# Patient Record
Sex: Female | Born: 1960
Health system: Southern US, Community
[De-identification: ages and names within clinical notes are randomized; demographics above are authoritative.]

## PROBLEM LIST (undated history)

## (undated) DIAGNOSIS — E039 Hypothyroidism, unspecified: Secondary | ICD-10-CM

## (undated) DIAGNOSIS — J45909 Unspecified asthma, uncomplicated: Secondary | ICD-10-CM

## (undated) DIAGNOSIS — D509 Iron deficiency anemia, unspecified: Secondary | ICD-10-CM

## (undated) DIAGNOSIS — G9332 Myalgic encephalomyelitis/chronic fatigue syndrome: Secondary | ICD-10-CM

## (undated) DIAGNOSIS — I10 Essential (primary) hypertension: Secondary | ICD-10-CM

## (undated) DIAGNOSIS — E785 Hyperlipidemia, unspecified: Secondary | ICD-10-CM

## (undated) DIAGNOSIS — Z8489 Family history of other specified conditions: Secondary | ICD-10-CM

## (undated) DIAGNOSIS — E063 Autoimmune thyroiditis: Secondary | ICD-10-CM

## (undated) DIAGNOSIS — Z87442 Personal history of urinary calculi: Secondary | ICD-10-CM

## (undated) DIAGNOSIS — G4730 Sleep apnea, unspecified: Secondary | ICD-10-CM

## (undated) DIAGNOSIS — R5382 Chronic fatigue, unspecified: Secondary | ICD-10-CM

## (undated) DIAGNOSIS — M797 Fibromyalgia: Secondary | ICD-10-CM

## (undated) DIAGNOSIS — M1990 Unspecified osteoarthritis, unspecified site: Secondary | ICD-10-CM

## (undated) DIAGNOSIS — F419 Anxiety disorder, unspecified: Secondary | ICD-10-CM

## (undated) DIAGNOSIS — I2510 Atherosclerotic heart disease of native coronary artery without angina pectoris: Secondary | ICD-10-CM

## (undated) HISTORY — PX: EAR CYST EXCISION: SHX22

## (undated) HISTORY — DX: Autoimmune thyroiditis: E06.3

## (undated) HISTORY — PX: LAPAROSCOPIC TOTAL HYSTERECTOMY: SUR800

## (undated) HISTORY — DX: Myalgic encephalomyelitis/chronic fatigue syndrome: G93.32

## (undated) HISTORY — PX: OTHER SURGICAL HISTORY: SHX169

## (undated) HISTORY — DX: Fibromyalgia: M79.7

## (undated) HISTORY — DX: Hyperlipidemia, unspecified: E78.5

## (undated) HISTORY — DX: Hypothyroidism, unspecified: E03.9

## (undated) HISTORY — DX: Iron deficiency anemia, unspecified: D50.9

## (undated) HISTORY — PX: URETERAL STENT PLACEMENT: SHX822

## (undated) HISTORY — DX: Chronic fatigue, unspecified: R53.82

## (undated) HISTORY — DX: Essential (primary) hypertension: I10

---
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
## 1998-06-24 ENCOUNTER — Emergency Department (HOSPITAL_COMMUNITY): Admission: EM | Admit: 1998-06-24 | Discharge: 1998-06-24 | Payer: Self-pay | Admitting: Emergency Medicine

## 1998-10-06 ENCOUNTER — Other Ambulatory Visit: Admission: RE | Admit: 1998-10-06 | Discharge: 1998-10-06 | Payer: Self-pay | Admitting: Obstetrics and Gynecology

## 1999-12-29 ENCOUNTER — Other Ambulatory Visit: Admission: RE | Admit: 1999-12-29 | Discharge: 1999-12-29 | Payer: Self-pay | Admitting: Gynecology

## 2004-02-23 ENCOUNTER — Emergency Department (HOSPITAL_COMMUNITY): Admission: EM | Admit: 2004-02-23 | Discharge: 2004-02-23 | Payer: Self-pay | Admitting: Emergency Medicine

## 2011-11-14 HISTORY — PX: VAGINAL HYSTERECTOMY: SUR661

## 2013-01-21 ENCOUNTER — Other Ambulatory Visit: Payer: Self-pay | Admitting: Otolaryngology

## 2013-01-21 DIAGNOSIS — H906 Mixed conductive and sensorineural hearing loss, bilateral: Secondary | ICD-10-CM

## 2013-01-21 DIAGNOSIS — H7190 Unspecified cholesteatoma, unspecified ear: Secondary | ICD-10-CM

## 2013-01-21 DIAGNOSIS — H6980 Other specified disorders of Eustachian tube, unspecified ear: Secondary | ICD-10-CM

## 2013-01-21 DIAGNOSIS — H7100 Cholesteatoma of attic, unspecified ear: Secondary | ICD-10-CM

## 2013-01-24 ENCOUNTER — Ambulatory Visit
Admission: RE | Admit: 2013-01-24 | Discharge: 2013-01-24 | Disposition: A | Discharge status: Home / Self Care | Payer: Medicare Other | Source: Ambulatory Visit | Attending: Otolaryngology | Admitting: Otolaryngology

## 2013-01-24 DIAGNOSIS — H7100 Cholesteatoma of attic, unspecified ear: Secondary | ICD-10-CM

## 2013-01-24 DIAGNOSIS — H6980 Other specified disorders of Eustachian tube, unspecified ear: Secondary | ICD-10-CM

## 2013-01-24 DIAGNOSIS — H7190 Unspecified cholesteatoma, unspecified ear: Secondary | ICD-10-CM

## 2013-01-24 DIAGNOSIS — H906 Mixed conductive and sensorineural hearing loss, bilateral: Secondary | ICD-10-CM

## 2014-12-25 DIAGNOSIS — R1011 Right upper quadrant pain: Secondary | ICD-10-CM | POA: Diagnosis not present

## 2014-12-28 DIAGNOSIS — R112 Nausea with vomiting, unspecified: Secondary | ICD-10-CM | POA: Diagnosis not present

## 2014-12-28 DIAGNOSIS — N132 Hydronephrosis with renal and ureteral calculous obstruction: Secondary | ICD-10-CM | POA: Diagnosis not present

## 2014-12-28 DIAGNOSIS — J45909 Unspecified asthma, uncomplicated: Secondary | ICD-10-CM | POA: Diagnosis not present

## 2014-12-28 DIAGNOSIS — R109 Unspecified abdominal pain: Secondary | ICD-10-CM | POA: Diagnosis not present

## 2014-12-28 DIAGNOSIS — R1011 Right upper quadrant pain: Secondary | ICD-10-CM | POA: Diagnosis not present

## 2014-12-28 DIAGNOSIS — N200 Calculus of kidney: Secondary | ICD-10-CM | POA: Diagnosis not present

## 2014-12-28 DIAGNOSIS — R1013 Epigastric pain: Secondary | ICD-10-CM | POA: Diagnosis not present

## 2014-12-28 DIAGNOSIS — I10 Essential (primary) hypertension: Secondary | ICD-10-CM | POA: Diagnosis not present

## 2014-12-28 DIAGNOSIS — F329 Major depressive disorder, single episode, unspecified: Secondary | ICD-10-CM | POA: Diagnosis not present

## 2014-12-29 DIAGNOSIS — N132 Hydronephrosis with renal and ureteral calculous obstruction: Secondary | ICD-10-CM | POA: Diagnosis not present

## 2015-01-05 DIAGNOSIS — R110 Nausea: Secondary | ICD-10-CM | POA: Diagnosis not present

## 2015-01-05 DIAGNOSIS — R1011 Right upper quadrant pain: Secondary | ICD-10-CM | POA: Diagnosis not present

## 2015-01-18 DIAGNOSIS — J1100 Influenza due to unidentified influenza virus with unspecified type of pneumonia: Secondary | ICD-10-CM | POA: Diagnosis not present

## 2015-01-25 DIAGNOSIS — I10 Essential (primary) hypertension: Secondary | ICD-10-CM | POA: Diagnosis not present

## 2015-01-25 DIAGNOSIS — J9801 Acute bronchospasm: Secondary | ICD-10-CM | POA: Diagnosis not present

## 2015-01-25 DIAGNOSIS — Z79899 Other long term (current) drug therapy: Secondary | ICD-10-CM | POA: Diagnosis not present

## 2015-01-25 DIAGNOSIS — E785 Hyperlipidemia, unspecified: Secondary | ICD-10-CM | POA: Diagnosis not present

## 2015-01-25 DIAGNOSIS — E039 Hypothyroidism, unspecified: Secondary | ICD-10-CM | POA: Diagnosis not present

## 2015-01-25 DIAGNOSIS — R5383 Other fatigue: Secondary | ICD-10-CM | POA: Diagnosis not present

## 2015-01-25 DIAGNOSIS — E559 Vitamin D deficiency, unspecified: Secondary | ICD-10-CM | POA: Diagnosis not present

## 2015-03-30 DIAGNOSIS — W6139XS Other contact with chicken, sequela: Secondary | ICD-10-CM | POA: Diagnosis not present

## 2015-03-30 DIAGNOSIS — L03116 Cellulitis of left lower limb: Secondary | ICD-10-CM | POA: Diagnosis not present

## 2015-04-15 DIAGNOSIS — L820 Inflamed seborrheic keratosis: Secondary | ICD-10-CM | POA: Diagnosis not present

## 2015-04-15 DIAGNOSIS — D235 Other benign neoplasm of skin of trunk: Secondary | ICD-10-CM | POA: Diagnosis not present

## 2015-04-20 DIAGNOSIS — R790 Abnormal level of blood mineral: Secondary | ICD-10-CM | POA: Diagnosis not present

## 2015-04-20 DIAGNOSIS — G2581 Restless legs syndrome: Secondary | ICD-10-CM | POA: Diagnosis not present

## 2015-04-20 DIAGNOSIS — E039 Hypothyroidism, unspecified: Secondary | ICD-10-CM | POA: Diagnosis not present

## 2015-04-20 DIAGNOSIS — I10 Essential (primary) hypertension: Secondary | ICD-10-CM | POA: Diagnosis not present

## 2015-04-20 DIAGNOSIS — Z79899 Other long term (current) drug therapy: Secondary | ICD-10-CM | POA: Diagnosis not present

## 2015-04-20 DIAGNOSIS — M797 Fibromyalgia: Secondary | ICD-10-CM | POA: Diagnosis not present

## 2015-04-20 DIAGNOSIS — E559 Vitamin D deficiency, unspecified: Secondary | ICD-10-CM | POA: Diagnosis not present

## 2015-12-14 DIAGNOSIS — E039 Hypothyroidism, unspecified: Secondary | ICD-10-CM | POA: Diagnosis not present

## 2015-12-14 DIAGNOSIS — M797 Fibromyalgia: Secondary | ICD-10-CM | POA: Diagnosis not present

## 2015-12-14 DIAGNOSIS — I10 Essential (primary) hypertension: Secondary | ICD-10-CM | POA: Diagnosis not present

## 2015-12-14 DIAGNOSIS — R413 Other amnesia: Secondary | ICD-10-CM | POA: Diagnosis not present

## 2015-12-14 DIAGNOSIS — R5382 Chronic fatigue, unspecified: Secondary | ICD-10-CM | POA: Diagnosis not present

## 2015-12-15 DIAGNOSIS — E039 Hypothyroidism, unspecified: Secondary | ICD-10-CM | POA: Diagnosis not present

## 2015-12-15 DIAGNOSIS — R790 Abnormal level of blood mineral: Secondary | ICD-10-CM | POA: Diagnosis not present

## 2015-12-15 DIAGNOSIS — E785 Hyperlipidemia, unspecified: Secondary | ICD-10-CM | POA: Diagnosis not present

## 2015-12-15 DIAGNOSIS — R413 Other amnesia: Secondary | ICD-10-CM | POA: Diagnosis not present

## 2015-12-15 DIAGNOSIS — E559 Vitamin D deficiency, unspecified: Secondary | ICD-10-CM | POA: Diagnosis not present

## 2015-12-15 DIAGNOSIS — Z79899 Other long term (current) drug therapy: Secondary | ICD-10-CM | POA: Diagnosis not present

## 2015-12-15 DIAGNOSIS — I10 Essential (primary) hypertension: Secondary | ICD-10-CM | POA: Diagnosis not present

## 2015-12-22 DIAGNOSIS — R413 Other amnesia: Secondary | ICD-10-CM | POA: Diagnosis not present

## 2016-01-06 DIAGNOSIS — J019 Acute sinusitis, unspecified: Secondary | ICD-10-CM | POA: Diagnosis not present

## 2016-01-06 DIAGNOSIS — J209 Acute bronchitis, unspecified: Secondary | ICD-10-CM | POA: Diagnosis not present

## 2016-01-13 DIAGNOSIS — R233 Spontaneous ecchymoses: Secondary | ICD-10-CM | POA: Diagnosis not present

## 2016-01-13 DIAGNOSIS — B079 Viral wart, unspecified: Secondary | ICD-10-CM | POA: Diagnosis not present

## 2016-01-13 DIAGNOSIS — L853 Xerosis cutis: Secondary | ICD-10-CM | POA: Diagnosis not present

## 2016-01-14 DIAGNOSIS — Z1231 Encounter for screening mammogram for malignant neoplasm of breast: Secondary | ICD-10-CM | POA: Diagnosis not present

## 2016-02-14 DIAGNOSIS — L03116 Cellulitis of left lower limb: Secondary | ICD-10-CM | POA: Diagnosis not present

## 2016-02-14 DIAGNOSIS — S91332A Puncture wound without foreign body, left foot, initial encounter: Secondary | ICD-10-CM | POA: Diagnosis not present

## 2016-02-22 DIAGNOSIS — E039 Hypothyroidism, unspecified: Secondary | ICD-10-CM | POA: Diagnosis not present

## 2016-02-23 DIAGNOSIS — M797 Fibromyalgia: Secondary | ICD-10-CM | POA: Diagnosis not present

## 2016-02-23 DIAGNOSIS — M79672 Pain in left foot: Secondary | ICD-10-CM | POA: Diagnosis not present

## 2016-06-13 DIAGNOSIS — E785 Hyperlipidemia, unspecified: Secondary | ICD-10-CM | POA: Diagnosis not present

## 2016-06-13 DIAGNOSIS — E039 Hypothyroidism, unspecified: Secondary | ICD-10-CM | POA: Diagnosis not present

## 2016-06-13 DIAGNOSIS — J3489 Other specified disorders of nose and nasal sinuses: Secondary | ICD-10-CM | POA: Diagnosis not present

## 2016-06-13 DIAGNOSIS — E611 Iron deficiency: Secondary | ICD-10-CM | POA: Diagnosis not present

## 2016-06-13 DIAGNOSIS — I10 Essential (primary) hypertension: Secondary | ICD-10-CM | POA: Diagnosis not present

## 2016-06-13 DIAGNOSIS — R5382 Chronic fatigue, unspecified: Secondary | ICD-10-CM | POA: Diagnosis not present

## 2016-06-13 DIAGNOSIS — Z79899 Other long term (current) drug therapy: Secondary | ICD-10-CM | POA: Diagnosis not present

## 2016-07-05 DIAGNOSIS — R079 Chest pain, unspecified: Secondary | ICD-10-CM | POA: Diagnosis not present

## 2016-07-05 DIAGNOSIS — E611 Iron deficiency: Secondary | ICD-10-CM | POA: Diagnosis not present

## 2016-07-06 ENCOUNTER — Other Ambulatory Visit: Payer: Self-pay | Admitting: Internal Medicine

## 2016-07-06 DIAGNOSIS — R079 Chest pain, unspecified: Secondary | ICD-10-CM

## 2016-07-07 ENCOUNTER — Telehealth (HOSPITAL_COMMUNITY): Payer: Self-pay | Admitting: *Deleted

## 2016-07-07 NOTE — Telephone Encounter (Signed)
Patient given detailed instructions per Myocardial Perfusion Study Information Sheet for the test on 07/10/16 at 7:30. Patient notified to arrive 15 minutes early and that it is imperative to arrive on time for appointment to keep from having the test rescheduled.  If you need to cancel or reschedule your appointment, please call the office within 24 hours of your appointment. Failure to do so may result in a cancellation of your appointment, and a $50 no show fee. Patient verbalized understanding.Sabrina Snyder

## 2016-07-10 ENCOUNTER — Encounter (INDEPENDENT_AMBULATORY_CARE_PROVIDER_SITE_OTHER): Payer: Self-pay

## 2016-07-10 ENCOUNTER — Ambulatory Visit (HOSPITAL_COMMUNITY): Payer: Medicare Other | Attending: Cardiology

## 2016-07-10 DIAGNOSIS — I10 Essential (primary) hypertension: Secondary | ICD-10-CM | POA: Diagnosis not present

## 2016-07-10 DIAGNOSIS — R079 Chest pain, unspecified: Secondary | ICD-10-CM

## 2016-07-10 LAB — MYOCARDIAL PERFUSION IMAGING
CHL CUP NUCLEAR SRS: 0
CHL CUP NUCLEAR SSS: 1
LHR: 0.23
LV dias vol: 67 mL (ref 46–106)
LV sys vol: 20 mL
NUC STRESS TID: 1.02
Peak HR: 118 {beats}/min
Rest HR: 78 {beats}/min
SDS: 1

## 2016-07-10 MED ORDER — REGADENOSON 0.4 MG/5ML IV SOLN
0.4000 mg | Freq: Once | INTRAVENOUS | Status: AC
  Administered 2016-07-10: 0.4 mg via INTRAVENOUS

## 2016-07-10 MED ORDER — TECHNETIUM TC 99M TETROFOSMIN IV KIT
33.0000 | PACK | Freq: Once | INTRAVENOUS | Status: AC | PRN
  Administered 2016-07-10: 33 via INTRAVENOUS
  Filled 2016-07-10: qty 33

## 2016-07-10 MED ORDER — TECHNETIUM TC 99M TETROFOSMIN IV KIT
10.7000 | PACK | Freq: Once | INTRAVENOUS | Status: AC | PRN
  Administered 2016-07-10: 11 via INTRAVENOUS
  Filled 2016-07-10: qty 11

## 2016-07-18 DIAGNOSIS — I10 Essential (primary) hypertension: Secondary | ICD-10-CM | POA: Diagnosis not present

## 2016-09-12 DIAGNOSIS — Z23 Encounter for immunization: Secondary | ICD-10-CM | POA: Diagnosis not present

## 2016-09-12 DIAGNOSIS — I10 Essential (primary) hypertension: Secondary | ICD-10-CM | POA: Diagnosis not present

## 2016-09-12 DIAGNOSIS — E538 Deficiency of other specified B group vitamins: Secondary | ICD-10-CM | POA: Diagnosis not present

## 2016-09-12 DIAGNOSIS — R5382 Chronic fatigue, unspecified: Secondary | ICD-10-CM | POA: Diagnosis not present

## 2016-09-13 ENCOUNTER — Encounter: Payer: Self-pay | Admitting: Gastroenterology

## 2016-09-21 ENCOUNTER — Other Ambulatory Visit: Payer: Self-pay

## 2016-09-26 ENCOUNTER — Ambulatory Visit: Payer: Medicare Other | Admitting: Gastroenterology

## 2016-11-19 NOTE — Progress Notes (Deleted)
Cardiology Office Note   Date:  11/19/2016   ID:  BURLEY BARTOLUCCI, DOB 07-Aug-1961, MRN [id number]  PCP:  No primary care provider on file.  Cardiologist:   Jenkins Rouge, MD   No chief complaint on file.     History of Present Illness: Sabrina Snyder is a 56 y.o. female who presents for evaluation of chest pain.   Myovue reviewed done 07/10/16 normal perfusion EF 70%   Past Medical History:  Diagnosis Date  . CFS (chronic fatigue syndrome)   . Fibromyalgia   . Hashimoto's disease   . IDA (iron deficiency anemia)     Past Surgical History:  Procedure Laterality Date  . LAPAROSCOPIC TOTAL HYSTERECTOMY    . thyroid ablation       Current Outpatient Prescriptions  Medication Sig Dispense Refill  . amLODipine (NORVASC) 5 MG tablet Take 1 tablet (5 mg total) by mouth daily.    . benazepril (LOTENSIN) 40 MG tablet Take 1 tablet (40 mg total) by mouth daily.    [name] buPROPion (WELLBUTRIN SR) 150 MG 12 hr tablet Take 1 tablet (150 mg total) by mouth 2 (two) times daily.    . cyclobenzaprine (FLEXERIL) 10 MG tablet Take 1 tablet (10 mg total) by mouth at bedtime. 30 tablet 0  . DULoxetine (CYMBALTA) 60 MG capsule Take 1 capsule (60 mg total) by mouth daily.  3  . estrogens, conjugated, (PREMARIN) 0.625 MG tablet Take 1 tablet (0.625 mg total) by mouth daily. Take daily for 21 days then do not take for 7 days.    [name] gabapentin (NEURONTIN) 300 MG capsule Take 1 capsule (300 mg total) by mouth 3 (three) times daily.    . hydrochlorothiazide (HYDRODIURIL) 25 MG tablet Take 1 tablet (25 mg total) by mouth daily.    . pramipexole (MIRAPEX) 0.125 MG tablet Take 1 tablet (0.125 mg total) by mouth 3 (three) times daily.    [name] thyroid (ARMOUR THYROID) 120 MG tablet Take 1 tablet (120 mg total) by mouth daily before breakfast.    . traMADol (ULTRAM) 50 MG tablet Take 1 tablet (50 mg total) by mouth every 6 (six) hours as needed. 30 tablet 0   No current facility-administered medications for  this visit.     Allergies:   Patient has no known allergies.    Social History:  The patient  reports that she has never smoked. She does not have any smokeless tobacco history on file. She reports that she does not drink alcohol.   Family History:  The patient's family history includes Atrial fibrillation in her mother; Diabetes in her father; Heart disease in her father; Hyperlipidemia in her father; Hypertension in her brother, father, mother, sister, and sister; Pancreatic cancer in her father; Schizophrenia in her sister.    ROS:  Please see the history of present illness.   Otherwise, review of systems are positive for {NONE DEFAULTED:18576::"none"}.   All other systems are reviewed and negative.    PHYSICAL EXAM: VS:  There were no vitals taken for this visit. , BMI There is no height or weight on file to calculate BMI. Affect appropriate Healthy:  appears stated age 84: normal Neck supple with no adenopathy JVP normal no bruits no thyromegaly Lungs clear with no wheezing and good diaphragmatic motion Heart:  S1/S2 no murmur, no rub, gallop or click PMI normal Abdomen: benighn, BS positve, no tenderness, no AAA no bruit.  No HSM or HJR Distal pulses intact with no bruits  No edema Neuro non-focal Skin warm and dry No muscular weakness    EKG:  ***   Recent Labs: No results found for requested labs within last 8760 hours.    Lipid Panel No results found for: CHOL, TRIG, HDL, CHOLHDL, VLDL, LDLCALC, LDLDIRECT    Wt Readings from Last 3 Encounters:  No data found for Wt      Other studies Reviewed: Additional studies/ records that were reviewed today include: ***.    ASSESSMENT AND PLAN:  1.  ***   Current medicines are reviewed at length with the patient today.  The patient {ACTIONS; HAS/DOES NOT HAVE:19233} concerns regarding medicines.  The following changes have been made:  {PLAN; NO CHANGE:13088:s}  Labs/ tests ordered today include: *** No  orders of the defined types were placed in this encounter.    Disposition:   FU with ***     Signed, Jenkins Rouge, MD  11/19/2016 1:10 PM    [facility] [place], [place], [place]  [address] Phone: [phone]; Fax: [phone]

## 2016-11-22 ENCOUNTER — Ambulatory Visit: Payer: Medicare Other | Admitting: Cardiovascular Disease

## 2016-12-25 NOTE — Progress Notes (Signed)
Cardiology Office Note   Date:  01/04/2017   ID:  Sabrina Snyder, DOB 1961/04/21, MRN [id number]  PCP:  Maurice Small  Cardiologist:   Jenkins Rouge, MD   Chief Complaint  Patient presents with  . Establish Care      History of Present Illness: Sabrina Snyder is a 56 y.o. female who presents for chest pain. History of CFS (chronic fatigue syndrome)  disabled through SS For over 20 years. Fibromyalgia Rx with tramadol and gabapentin.  Gets tightness between breast bones, experiences pressure on both sides of anterior chest. Random, never with exercise Some pain when moving [place]. ASA improved Father MI in 26's She is concerned cause he had normal stress test before needing CABG  Recently had ACE -> ARB ? Due to angioedema   LexiscanMyovue 07/10/16 reviewed Normal no ischemia or infarction EF 70% HTN response to exercise   Past Medical History:  Diagnosis Date  . CFS (chronic fatigue syndrome)   . Fibromyalgia   . Hashimoto's disease   . IDA (iron deficiency anemia)     Past Surgical History:  Procedure Laterality Date  . LAPAROSCOPIC TOTAL HYSTERECTOMY    . thyroid ablation       Current Outpatient Prescriptions  Medication Sig Dispense Refill  . acetaZOLAMIDE (DIAMOX) 250 MG tablet Take 250 mg by mouth 3 (three) times daily as needed (headaches).    . ALPRAZolam (XANAX) 0.25 MG tablet Take 0.25 mg by mouth daily as needed for anxiety.    [name] amLODipine (NORVASC) 5 MG tablet Take 1 tablet (5 mg total) by mouth daily.    . benazepril (LOTENSIN) 40 MG tablet Take 1 tablet (40 mg total) by mouth daily.    [name] buPROPion (WELLBUTRIN SR) 150 MG 12 hr tablet Take 1 tablet (150 mg total) by mouth 2 (two) times daily.    . Cyanocobalamin (VITAMIN B-12) 5000 MCG SUBL Place 1 each under the tongue daily.    . cyclobenzaprine (FLEXERIL) 10 MG tablet Take 1 tablet (10 mg total) by mouth at bedtime. 30 tablet 0  . DULoxetine (CYMBALTA) 60 MG capsule Take 1 capsule (60 mg total) by  mouth daily.  3  . estrogens, conjugated, (PREMARIN) 0.625 MG tablet Take 1 tablet (0.625 mg total) by mouth daily. Take daily for 21 days then do not take for 7 days.    [name] gabapentin (NEURONTIN) 300 MG capsule Take 1 capsule (300 mg total) by mouth 3 (three) times daily.    . hydrochlorothiazide (HYDRODIURIL) 25 MG tablet Take 1 tablet (25 mg total) by mouth daily.    . IRON PO Take 1 tablet by mouth daily.    [name] losartan (COZAAR) 100 MG tablet Take 100 mg by mouth daily.    . nitroGLYCERIN (NITROSTAT) 0.4 MG SL tablet Place 0.4 mg under the tongue every 5 (five) minutes x 3 doses as needed for chest pain.    . Omega-3 Fatty Acids (SALMON OIL-1000 PO) Take 1 tablet by mouth daily.    . pramipexole (MIRAPEX) 0.125 MG tablet Take 0.125 mg by mouth 3 (three) times daily as needed (restless leg).    [name] thyroid (ARMOUR THYROID) 120 MG tablet Take 1 tablet (120 mg total) by mouth daily before breakfast.    . traMADol (ULTRAM) 50 MG tablet Take 1 tablet (50 mg total) by mouth every 6 (six) hours as needed. 30 tablet 0   No current facility-administered medications for this visit.     Allergies:  Patient has no known allergies.    Social History:  The patient  reports that she has never smoked. She has never used smokeless tobacco. She reports that she does not drink alcohol.   Family History:  The patient's family history includes Atrial fibrillation in her mother; Diabetes in her father; Heart disease in her father; Hyperlipidemia in her father; Hypertension in her brother, father, mother, sister, and sister; Pancreatic cancer in her father; Schizophrenia in her sister.    ROS:  Please see the history of present illness.   Otherwise, review of systems are positive for none.   All other systems are reviewed and negative.    PHYSICAL EXAM: VS:  BP 130/84   Pulse 93   Ht 5\' 3"  (1.6 m)   Wt 176 lb 1.9 oz (79.9 kg)   SpO2 95%   BMI 31.20 kg/m  , BMI Body mass index is 31.2 kg/m. Affect  appropriate Healthy:  appears stated age 55: normal Neck supple with no adenopathy JVP normal no bruits no thyromegaly Lungs clear with no wheezing and good diaphragmatic motion Heart:  S1/S2 no murmur, no rub, gallop or click PMI normal Abdomen: benighn, BS positve, no tenderness, no AAA no bruit.  No HSM or HJR Distal pulses intact with no bruits No edema Neuro non-focal Skin warm and dry No muscular weakness    EKG:  SR rate 96 RAD low voltage due to body habitus    Recent Labs: No results found for requested labs within last 8760 hours.    Lipid Panel No results found for: CHOL, TRIG, HDL, CHOLHDL, VLDL, LDLCALC, LDLDIRECT    Wt Readings from Last 3 Encounters:  01/04/17 176 lb 1.9 oz (79.9 kg)      Other studies Reviewed: Additional studies/ records that were reviewed today include: Notes from primary ECG and myovue from August 2017 .    ASSESSMENT AND PLAN:  1.  Chest Pain atypical normal myovue suggest she have coronary calcium score to assess her risk of coronary event In next 5 years given fathers history she will try to do in March 2. Thyroid TSH normal continue replacement  3. HTN: labile f/u primary continue norvasc and diuretic suspect she could go back on ARB doubt angioedema 4. Fibromyalgia:  On cymbalta and PRN tramadol f/u primary    Current medicines are reviewed at length with the patient today.  The patient does not have concerns regarding medicines.  The following changes have been made:  no change  Labs/ tests ordered today include: Calcium Score   Orders Placed This Encounter  Procedures  . EKG 12-Lead     Disposition:   FU with [place] PRN      Signed, Jenkins Rouge, MD  01/04/2017 10:35 AM    [facility] [place], [place], [place]  [address] Phone: [phone]; Fax: [phone]

## 2016-12-28 ENCOUNTER — Encounter: Payer: Self-pay | Admitting: Gastroenterology

## 2017-01-04 ENCOUNTER — Encounter: Payer: Self-pay | Admitting: Cardiovascular Disease

## 2017-01-04 ENCOUNTER — Encounter (INDEPENDENT_AMBULATORY_CARE_PROVIDER_SITE_OTHER): Payer: Self-pay

## 2017-01-04 ENCOUNTER — Ambulatory Visit (INDEPENDENT_AMBULATORY_CARE_PROVIDER_SITE_OTHER): Payer: Medicare Other | Admitting: Cardiovascular Disease

## 2017-01-04 VITALS — BP 130/84 | HR 93 | Ht 63.0 in | Wt 176.1 lb

## 2017-01-04 DIAGNOSIS — Z7689 Persons encountering health services in other specified circumstances: Secondary | ICD-10-CM | POA: Diagnosis not present

## 2017-01-04 NOTE — Patient Instructions (Addendum)
Medication Instructions:  Your physician recommends that you continue on your current medications as directed. Please refer to the Current Medication list given to you today.  Labwork: NONE  Testing/Procedures: Cardiac CT scanning for Calcium Score, (CAT scanning), is a noninvasive, special x-ray that produces cross-sectional images of the body using x-rays and a computer. CT scans help physicians diagnose and treat medical conditions. For some CT exams, a contrast material is used to enhance visibility in the area of the body being studied. CT scans provide greater clarity and reveal more details than regular x-ray exams.  Follow-Up: Your physician wants you to follow-up in: as needed with Dr. Johnsie Cancel.   If you need a refill on your cardiac medications before your next appointment, please call your pharmacy.

## 2017-01-11 ENCOUNTER — Ambulatory Visit (INDEPENDENT_AMBULATORY_CARE_PROVIDER_SITE_OTHER): Payer: Medicare Other | Admitting: Neurology

## 2017-01-11 ENCOUNTER — Encounter: Payer: Self-pay | Admitting: Neurology

## 2017-01-11 VITALS — BP 110/70 | HR 90 | Temp 98.5°F | Resp 16 | Ht 62.0 in | Wt 178.1 lb

## 2017-01-11 DIAGNOSIS — R413 Other amnesia: Secondary | ICD-10-CM | POA: Diagnosis not present

## 2017-01-11 DIAGNOSIS — R5382 Chronic fatigue, unspecified: Secondary | ICD-10-CM | POA: Insufficient documentation

## 2017-01-11 DIAGNOSIS — G4733 Obstructive sleep apnea (adult) (pediatric): Secondary | ICD-10-CM | POA: Insufficient documentation

## 2017-01-11 NOTE — Progress Notes (Signed)
NEUROLOGY CONSULTATION NOTE  Sabrina Snyder MRN: [id number] DOB: 11/15/1960  Referring provider: Dr. Maurice Small Primary care provider: Dr. Maurice Small  Reason for consult:  Memory loss  Dear Dr Justin Mend:  Thank you for your kind referral of Sabrina Snyder for consultation of the above symptoms. Although her history is well known to you, please allow me to reiterate it for the purpose of our medical record. Records and images were personally reviewed where available.  HISTORY OF PRESENT ILLNESS: This is a pleasant 56 year old right-handed woman with a history of Hashimoto's disease, fibromyalgia, chronic fatigue syndrome, presenting for evaluation of cognitive changes. She has noticed changes for the past couple of years and it has been getting worse. She feels like her memory is below average for her age. She does note a history of chronic fatigue and fibromyalgia, but is concerned if something else is going on. She has word-finding difficulties, she knows the words in her brain, it feels like there are a lot of words in there, but she tries to get them out of her mouth. Sometimes the oddest words come out when she speaks, or she would say words backwards such as "door the close." She has troubles recalling the names of her niece and nephews. Her children finish her sentences. They do come back later on. One time she picked up her grandson from school and he spent the weekend with her, but she could not recall what they did no Friday. Her mother has told her she repeats herself and that her mother already answered that question. Her mother would talk about vacations that she does not recall. She misplaces things frequently. She has gotten lost driving and would find herself in an unfamiliar place. One time around 6 years ago, she could not find her car in the mall parking lot, she did not remember where she was and felt very scared. She would forget why she went to a certain place. She lives with  her boyfriend and her daughter, and has missed a lot of bill payments. She would get phone calls for missed bills that she thought she had taken care of. She is pretty good with remembering her medications. Before she was diagnosed with chronic fatigue syndrome 18 years ago, she worked with numbers and had good recall, but now she has difficulty recalling when sent a text or a code word to put in. She gets them backwards. She has problems spelling names. The amount of work she puts in to remember things makes her feel exhausted. She reports having an MRI brain around 2 years ago in [facility] for the memory changes, reported as normal. She has a diagnosis of sleep apnea and has fallen off using her CPAP in the past year. She reports being on Provigil in the past for the chronic fatigue, which made her brain clearer but caused jitteriness. Her previous PCP in [place] tried Adderall but this did not help her memory. She has had thyroid issues "up and down," last TSH was normal 7 months ago. She reports a history of overdose on clonazepam and Darvocet 10 years ago where she required ICU care. She denies any family history of dementia, no alcohol use. She denies any dizziness, diplopia, dysarthria/dysphagia, neck pain, bowel/bladder dysfunction, anosmia, or tremors. She has chronic back pain.    PAST MEDICAL HISTORY: Past Medical History:  Diagnosis Date  . CFS (chronic fatigue syndrome)   . Fibromyalgia   . Hashimoto's disease   .  IDA (iron deficiency anemia)     PAST SURGICAL HISTORY: Past Surgical History:  Procedure Laterality Date  . LAPAROSCOPIC TOTAL HYSTERECTOMY    . thyroid ablation      MEDICATIONS: Current Outpatient Prescriptions on File Prior to Visit  Medication Sig Dispense Refill  . acetaZOLAMIDE (DIAMOX) 250 MG tablet Take 250 mg by mouth 3 (three) times daily as needed (headaches).    . ALPRAZolam (XANAX) 0.25 MG tablet Take 0.25 mg by mouth daily as needed for anxiety.      [name] amLODipine (NORVASC) 5 MG tablet Take 1 tablet (5 mg total) by mouth daily.    . benazepril (LOTENSIN) 40 MG tablet Take 1 tablet (40 mg total) by mouth daily.    [name] buPROPion (WELLBUTRIN SR) 150 MG 12 hr tablet Take 1 tablet (150 mg total) by mouth 2 (two) times daily.    . Cyanocobalamin (VITAMIN B-12) 5000 MCG SUBL Place 1 each under the tongue daily.    . cyclobenzaprine (FLEXERIL) 10 MG tablet Take 1 tablet (10 mg total) by mouth at bedtime. 30 tablet 0  . DULoxetine (CYMBALTA) 60 MG capsule Take 1 capsule (60 mg total) by mouth daily.  3  . estrogens, conjugated, (PREMARIN) 0.625 MG tablet Take 1 tablet (0.625 mg total) by mouth daily. Take daily for 21 days then do not take for 7 days.    [name] gabapentin (NEURONTIN) 300 MG capsule Take 1 capsule (300 mg total) by mouth 3 (three) times daily.    . hydrochlorothiazide (HYDRODIURIL) 25 MG tablet Take 1 tablet (25 mg total) by mouth daily.    . IRON PO Take 1 tablet by mouth daily.    [name] losartan (COZAAR) 100 MG tablet Take 100 mg by mouth daily.    . nitroGLYCERIN (NITROSTAT) 0.4 MG SL tablet Place 0.4 mg under the tongue every 5 (five) minutes x 3 doses as needed for chest pain.    . Omega-3 Fatty Acids (SALMON OIL-1000 PO) Take 1 tablet by mouth daily.    . pramipexole (MIRAPEX) 0.125 MG tablet Take 0.125 mg by mouth 3 (three) times daily as needed (restless leg).    [name] thyroid (ARMOUR THYROID) 120 MG tablet Take 1 tablet (120 mg total) by mouth daily before breakfast.    . traMADol (ULTRAM) 50 MG tablet Take 1 tablet (50 mg total) by mouth every 6 (six) hours as needed. 30 tablet 0   No current facility-administered medications on file prior to visit.     ALLERGIES: No Known Allergies  FAMILY HISTORY: Family History  Problem Relation Age of Onset  . Atrial fibrillation Mother   . Hypertension Mother   . Pancreatic cancer Father   . Heart disease Father   . Hyperlipidemia Father   . Diabetes Father   . Hypertension Father   .  Hypertension Sister   . Hypertension Brother   . Schizophrenia Sister   . Hypertension Sister     SOCIAL HISTORY: Social History   Social History  . Marital status: Legally Separated    Spouse name: N/A  . Number of children: N/A  . Years of education: N/A   Occupational History  . disabled    Social History Main Topics  . Smoking status: Never Smoker  . Smokeless tobacco: Never Used  . Alcohol use No  . Drug use: No  . Sexual activity: Yes    Partners: Male    Birth control/ protection: Surgical   Other Topics Concern  . Not on file  Social History Narrative  . No narrative on file    REVIEW OF SYSTEMS: Constitutional: No fevers, chills, or sweats, no generalized fatigue, change in appetite Eyes: No visual changes, double vision, eye pain Ear, nose and throat: No hearing loss, ear pain, nasal congestion, sore throat Cardiovascular: No chest pain, palpitations Respiratory:  No shortness of breath at rest or with exertion, wheezes GastrointestinaI: No nausea, vomiting, diarrhea, abdominal pain, fecal incontinence Genitourinary:  No dysuria, urinary retention or frequency Musculoskeletal:  No neck pain, back pain Integumentary: No rash, pruritus, skin lesions Neurological: as above Psychiatric: No depression, insomnia, anxiety Endocrine: No palpitations, fatigue, diaphoresis, mood swings, change in appetite, change in weight, increased thirst Hematologic/Lymphatic:  No anemia, purpura, petechiae. Allergic/Immunologic: no itchy/runny eyes, nasal congestion, recent allergic reactions, rashes  PHYSICAL EXAM: Vitals:   01/11/17 1045  BP: 110/70  Pulse: 90  Resp: 16  Temp: 98.5 F (36.9 C)   General: No acute distress Head:  Normocephalic/atraumatic Eyes: Fundoscopic exam shows bilateral sharp discs, no vessel changes, exudates, or hemorrhages Neck: supple, no paraspinal tenderness, full range of motion Back: No paraspinal tenderness Heart: regular rate and  rhythm Lungs: Clear to auscultation bilaterally. Vascular: No carotid bruits. Skin/Extremities: No rash, no edema Neurological Exam: Mental status: alert and oriented to person, place, and time, no dysarthria or aphasia, Fund of knowledge is appropriate.  Recent and remote memory are intact.  Attention and concentration are normal.    Able to name objects and repeat phrases.  Montreal Cognitive Assessment  01/11/2017  Visuospatial/ Executive (0/5) 5  Naming (0/3) 3  Attention: Read list of digits (0/2) 2  Attention: Read list of letters (0/1) 1  Attention: Serial 7 subtraction starting at 100 (0/3) 2  Language: Repeat phrase (0/2) 2  Language : Fluency (0/1) 1  Abstraction (0/2) 2  Delayed Recall (0/5) 4  Orientation (0/6) 6  Total 28   Cranial nerves: CN I: not tested CN II: pupils equal, round and reactive to light, visual fields intact, fundi unremarkable. CN III, IV, VI:  full range of motion, no nystagmus, no ptosis CN V: decreased pin on right V1-3 CN VII: upper and lower face symmetric CN VIII: hearing intact to finger rub CN IX, X: gag intact, uvula midline CN XI: sternocleidomastoid and trapezius muscles intact CN XII: tongue midline Bulk & Tone: normal, no fasciculations. Motor: 5/5 throughout with no pronator drift. Sensation: intact to light touch, cold, pin, vibration and joint position sense.  No extinction to double simultaneous stimulation.  Romberg test negative Deep Tendon Reflexes: brisk +2 on right UE and LE, +2 on left UE and LE, negative Hoffman sign, no ankle clonus Plantar responses: downgoing bilaterally Cerebellar: no incoordination on finger to nose, heel to shin. No dysdiadochokinesia Gait: narrow-based and steady, able to tandem walk adequately. Tremor: none  IMPRESSION: This is a 56 year old right-handed woman with a history of  Hashimoto's disease, fibromyalgia, chronic fatigue syndrome, presenting for evaluation of cognitive changes. Her  neurological exam shows brisk reflexes on the right side, otherwise non-focal. MOCA score normal 28/30. She had an MRI 2 years ago for memory complaints reported as normal. We discussed different causes of memory loss, including thyroid issues, poorly controlled sleep apnea, as well as depression/fibromyalgia/chronic fatigue. She will be scheduled for Neurocognitive testing to further evaluate her symptoms. We discussed the importance of control of vascular risk factors, physical exercise, and brain stimulation exercises for brain health. She will follow-up after Neuropsych testing.   Thank  you for allowing me to participate in the care of this patient. Please do not hesitate to call for any questions or concerns.   Ellouise Newer, M.D.  CC: Dr. Justin Mend

## 2017-01-11 NOTE — Patient Instructions (Addendum)
1. Schedule Neurocognitive testing with Dr. Si Raider 2. Start regular use of CPAP machine 3. Continue working on thyroid and chronic fatigue/fibromyalgia with your PCP 4. Physical exercise and brain stimulation exercises are important for brain health 5. Follow-up in 3 months, call for any changes  You have been referred for a neurocognitive evaluation in our office.   The evaluation consists of three appointments.   1. The first appointment is about 45 minutes and is a clinical interview with the neuropsychologist (Dr. Macarthur Critchley). You can bring someone with you to this appointment, as it is helpful for Dr. Si Raider to hear from both you and another adult who knows you well.   2. The second appointment is 2-3 hours long and is with the psychometrician Milana Kidney). You will complete a variety of tasks- mostly question-and-answer, some paper-and-pencil, some on the computer. There is nothing you need to do to prepare for this appointment, but having a good night's sleep prior to the testing, and bringing eyeglasses and hearing aids (if you wear them), is advised.   3. The final appointment is a follow-up with Dr. Si Raider where she will go over the test results with you and provide recommendations. This appointment is about 30 minutes.  If you would like a family member to receive this information as well, please bring them to the appointment.   We have to reserve several hours of the neuropsychologist's time and the psychometrician's time for your appointment. As such, please note that there is a No-Show fee of $100. If you are unable to attend any of your appointments, please contact our office as soon as possible to reschedule.

## 2017-01-17 ENCOUNTER — Other Ambulatory Visit: Payer: Medicare Other

## 2017-01-19 ENCOUNTER — Other Ambulatory Visit: Payer: Self-pay | Admitting: Family Medicine

## 2017-01-22 ENCOUNTER — Other Ambulatory Visit: Payer: Self-pay | Admitting: Family Medicine

## 2017-01-22 DIAGNOSIS — K469 Unspecified abdominal hernia without obstruction or gangrene: Secondary | ICD-10-CM

## 2017-01-24 ENCOUNTER — Other Ambulatory Visit: Payer: Medicare Other

## 2017-01-26 ENCOUNTER — Inpatient Hospital Stay
Admission: RE | Admit: 2017-01-26 | Discharge: 2017-01-26 | Disposition: A | Discharge status: Home / Self Care | Payer: Medicare Other | Source: Ambulatory Visit | Attending: Family Medicine | Admitting: Family Medicine

## 2017-01-29 ENCOUNTER — Other Ambulatory Visit: Payer: Medicare Other

## 2017-01-31 ENCOUNTER — Ambulatory Visit: Payer: Medicare Other | Admitting: Gastroenterology

## 2017-02-08 ENCOUNTER — Institutional Professional Consult (permissible substitution): Payer: Medicare Other | Admitting: Pulmonary Disease

## 2017-02-20 ENCOUNTER — Encounter: Payer: Medicare Other | Admitting: Psychology

## 2017-02-21 ENCOUNTER — Encounter: Payer: Self-pay | Admitting: Psychology

## 2017-02-21 ENCOUNTER — Inpatient Hospital Stay: Admission: RE | Admit: 2017-02-21 | Payer: Medicare Other | Source: Ambulatory Visit

## 2017-02-28 ENCOUNTER — Ambulatory Visit
Admission: RE | Admit: 2017-02-28 | Discharge: 2017-02-28 | Disposition: A | Discharge status: Home / Self Care | Payer: Medicare Other | Source: Ambulatory Visit | Attending: Family Medicine | Admitting: Family Medicine

## 2017-02-28 DIAGNOSIS — K469 Unspecified abdominal hernia without obstruction or gangrene: Secondary | ICD-10-CM

## 2017-03-13 ENCOUNTER — Encounter: Payer: Medicare Other | Admitting: Psychology

## 2017-04-03 ENCOUNTER — Ambulatory Visit: Payer: Medicare Other | Admitting: Neurology

## 2017-04-04 ENCOUNTER — Institutional Professional Consult (permissible substitution): Payer: Medicare Other | Admitting: Pulmonary Disease

## 2018-02-11 NOTE — Progress Notes (Signed)
Cardiology Office Note   Date:  02/14/2018   ID:  Sabrina Snyder, DOB [birth year], MRN [id number]  PCP:  Maurice Small  Cardiologist:   Jenkins Rouge, MD   No chief complaint on file.     History of Present Illness:  57 y.o. with CFS (chronic fatigue syndrome) on disability Fibromyalgia Rx with tramadol and gabapentin. Seen 06/2016  for atypical chest pain and had normal f/u myovue. Suggested calcium score for family history but she never got one  Father MI in 80's She is concerned cause he had normal stress test before needing CABG  Had ACE -> ARB ? Due to angioedema   LexiscanMyovue 07/10/16 reviewed Normal no ischemia or infarction EF 70% HTN response to exercise    She has been turned down for life insurance due to concern for CAD which has never been documented She has no current chest pain.   Past Medical History:  Diagnosis Date  . CFS (chronic fatigue syndrome)   . Fibromyalgia   . Hashimoto's disease   . IDA (iron deficiency anemia)     Past Surgical History:  Procedure Laterality Date  . LAPAROSCOPIC TOTAL HYSTERECTOMY    . thyroid ablation       Current Outpatient Medications  Medication Sig Dispense Refill  . acetaZOLAMIDE (DIAMOX) 250 MG tablet Take 250 mg by mouth 3 (three) times daily as needed (headaches).    . ALPRAZolam (XANAX) 0.25 MG tablet Take 0.25 mg by mouth daily as needed for anxiety.    [name] amLODipine (NORVASC) 5 MG tablet Take 1 tablet (5 mg total) by mouth daily.    [name] buPROPion (WELLBUTRIN SR) 150 MG 12 hr tablet Take 1 tablet (150 mg total) by mouth 2 (two) times daily.    . cyanocobalamin (,VITAMIN B-12,) 1000 MCG/ML injection as directed.  11  . DULoxetine (CYMBALTA) 60 MG capsule Take 1 capsule (60 mg total) by mouth daily.  3  . estrogens, conjugated, (PREMARIN) 0.625 MG tablet Take 1 tablet (0.625 mg total) by mouth daily. Take daily for 21 days then do not take for 7 days.    [name] gabapentin (NEURONTIN) 300 MG capsule Take 1 capsule  (300 mg total) by mouth 3 (three) times daily.    . hydrochlorothiazide (HYDRODIURIL) 25 MG tablet Take 1 tablet (25 mg total) by mouth daily.    . IRON PO Take 1 tablet by mouth daily.    [name] levothyroxine (SYNTHROID, LEVOTHROID) 200 MCG tablet Take 200 mcg by mouth daily before breakfast.    . losartan (COZAAR) 100 MG tablet Take 100 mg by mouth daily.    . Omega-3 Fatty Acids (SALMON OIL-1000 PO) Take 1 tablet by mouth daily.    . pramipexole (MIRAPEX) 0.125 MG tablet Take 0.125 mg by mouth 3 (three) times daily as needed (restless leg).    . traMADol (ULTRAM) 50 MG tablet Take 1 tablet (50 mg total) by mouth every 6 (six) hours as needed. 30 tablet 0   No current facility-administered medications for this visit.     Allergies:   Codeine and Sulfa antibiotics    Social History:  The patient  reports that she has never smoked. She has never used smokeless tobacco. She reports that she does not drink alcohol or use drugs.   Family History:  The patient's family history includes Atrial fibrillation in her mother; Diabetes in her father; Heart disease in her father; Hyperlipidemia in her father; Hypertension in her brother, father, mother, sister, and  sister; Pancreatic cancer in her father; Schizophrenia in her sister.    ROS:  Please see the history of present illness.   Otherwise, review of systems are positive for none.   All other systems are reviewed and negative.    PHYSICAL EXAM: VS:  BP 138/80   Pulse 85   Ht 5\' 3"  (1.6 m)   Wt 166 lb 8 oz (75.5 kg)   SpO2 97%   BMI 29.49 kg/m  , BMI Body mass index is 29.49 kg/m. Affect appropriate Healthy:  appears stated age 39: normal Neck supple with no adenopathy JVP normal no bruits no thyromegaly Lungs clear with no wheezing and good diaphragmatic motion Heart:  S1/S2 no murmur, no rub, gallop or click PMI normal Abdomen: benighn, BS positve, no tenderness, no AAA no bruit.  No HSM or HJR Distal pulses intact with no  bruits No edema Neuro non-focal Skin warm and dry No muscular weakness    EKG:  SR rate 96 RAD low voltage due to body habitus 4/4/9 SR rate 85 nonspecific ST changes    Recent Labs: No results found for requested labs within last 8760 hours.    Lipid Panel No results found for: CHOL, TRIG, HDL, CHOLHDL, VLDL, LDLCALC, LDLDIRECT    Wt Readings from Last 3 Encounters:  02/14/18 166 lb 8 oz (75.5 kg)  01/11/17 178 lb 1.6 oz (80.8 kg)  01/04/17 176 lb 1.9 oz (79.9 kg)      Other studies Reviewed: Additional studies/ records that were reviewed today include: Notes from primary ECG and myovue from August 2017 .    ASSESSMENT AND PLAN:  1.  Chest Pain atypical normal myovue 07/10/16  has not followed through with calcium score  She is having issues with getting life insurance She is not at known higher risk with no documented CAD will update exercise myovue to prove this so she can qualify for life insurance 2. Thyroid TSH normal continue replacement  3. HTN: labile f/u primary continue norvasc and diuretic suspect she could go back on ARB doubt angioedema 4. Fibromyalgia:  On cymbalta and PRN tramadol f/u primary    Current medicines are reviewed at length with the patient today.  The patient does not have concerns regarding medicines.  The following changes have been made:  no change  Labs/ tests ordered today include: Calcium Score   Orders Placed This Encounter  Procedures  . MYOCARDIAL PERFUSION IMAGING  . EKG 12-Lead     Disposition:   FU with [place] PRN      Signed, Jenkins Rouge, MD  02/14/2018 11:04 AM    [facility] [address], [place], [place]  [address] Phone: [phone]; Fax: [phone]

## 2018-02-14 ENCOUNTER — Ambulatory Visit: Payer: Medicare Other | Admitting: Cardiovascular Disease

## 2018-02-14 ENCOUNTER — Encounter: Payer: Self-pay | Admitting: Cardiovascular Disease

## 2018-02-14 VITALS — BP 138/80 | HR 85 | Ht 63.0 in | Wt 166.5 lb

## 2018-02-14 DIAGNOSIS — I10 Essential (primary) hypertension: Secondary | ICD-10-CM

## 2018-02-14 DIAGNOSIS — R0789 Other chest pain: Secondary | ICD-10-CM

## 2018-02-14 NOTE — Patient Instructions (Signed)

## 2018-02-18 ENCOUNTER — Telehealth (HOSPITAL_COMMUNITY): Payer: Self-pay | Admitting: *Deleted

## 2018-02-18 NOTE — Telephone Encounter (Signed)
Left message on voicemail per DPR in reference to upcoming appointment scheduled on 02/20/18 at 0730 with detailed instructions given per Myocardial Perfusion Study Information Sheet for the test. LM to arrive 15 minutes early, and that it is imperative to arrive on time for appointment to keep from having the test rescheduled. If you need to cancel or reschedule your appointment, please call the office within 24 hours of your appointment. Failure to do so may result in a cancellation of your appointment, and a $50 no show fee. Phone number given for call back for any questions.

## 2018-02-20 ENCOUNTER — Ambulatory Visit (HOSPITAL_COMMUNITY): Payer: Medicare Other | Attending: Cardiovascular Disease

## 2018-02-20 DIAGNOSIS — R0789 Other chest pain: Secondary | ICD-10-CM

## 2018-02-20 DIAGNOSIS — I10 Essential (primary) hypertension: Secondary | ICD-10-CM | POA: Diagnosis not present

## 2018-02-20 LAB — MYOCARDIAL PERFUSION IMAGING
CHL CUP RESTING HR STRESS: 89 {beats}/min
CSEPED: 7 min
CSEPHR: 88 %
Estimated workload: 8.5 METS
Exercise duration (sec): 0 s
LVDIAVOL: 55 mL (ref 46–106)
LVSYSVOL: 14 mL
MPHR: 165 {beats}/min
Peak HR: 146 {beats}/min
RATE: 0.33
SDS: 2
SRS: 12
SSS: 14
TID: 1

## 2018-02-20 MED ORDER — TECHNETIUM TC 99M TETROFOSMIN IV KIT
11.0000 | PACK | Freq: Once | INTRAVENOUS | Status: AC | PRN
  Administered 2018-02-20: 11 via INTRAVENOUS
  Filled 2018-02-20: qty 11

## 2018-02-20 MED ORDER — TECHNETIUM TC 99M TETROFOSMIN IV KIT
30.4000 | PACK | Freq: Once | INTRAVENOUS | Status: AC | PRN
  Administered 2018-02-20: 30.4 via INTRAVENOUS
  Filled 2018-02-20: qty 31

## 2018-12-16 DIAGNOSIS — B379 Candidiasis, unspecified: Secondary | ICD-10-CM | POA: Diagnosis not present

## 2018-12-16 DIAGNOSIS — R220 Localized swelling, mass and lump, head: Secondary | ICD-10-CM | POA: Diagnosis not present

## 2018-12-16 DIAGNOSIS — T3695XA Adverse effect of unspecified systemic antibiotic, initial encounter: Secondary | ICD-10-CM | POA: Diagnosis not present

## 2018-12-16 DIAGNOSIS — K047 Periapical abscess without sinus: Secondary | ICD-10-CM | POA: Diagnosis not present

## 2019-01-15 DIAGNOSIS — G5601 Carpal tunnel syndrome, right upper limb: Secondary | ICD-10-CM | POA: Diagnosis not present

## 2019-01-27 DIAGNOSIS — E782 Mixed hyperlipidemia: Secondary | ICD-10-CM | POA: Diagnosis not present

## 2019-01-27 DIAGNOSIS — M797 Fibromyalgia: Secondary | ICD-10-CM | POA: Diagnosis not present

## 2019-01-27 DIAGNOSIS — E038 Other specified hypothyroidism: Secondary | ICD-10-CM | POA: Diagnosis not present

## 2019-01-27 DIAGNOSIS — R69 Illness, unspecified: Secondary | ICD-10-CM | POA: Diagnosis not present

## 2019-01-27 DIAGNOSIS — G4733 Obstructive sleep apnea (adult) (pediatric): Secondary | ICD-10-CM | POA: Diagnosis not present

## 2019-01-27 DIAGNOSIS — I10 Essential (primary) hypertension: Secondary | ICD-10-CM | POA: Diagnosis not present

## 2019-01-27 DIAGNOSIS — G43809 Other migraine, not intractable, without status migrainosus: Secondary | ICD-10-CM | POA: Diagnosis not present

## 2019-01-27 DIAGNOSIS — G5603 Carpal tunnel syndrome, bilateral upper limbs: Secondary | ICD-10-CM | POA: Diagnosis not present

## 2019-01-27 DIAGNOSIS — E538 Deficiency of other specified B group vitamins: Secondary | ICD-10-CM | POA: Diagnosis not present

## 2019-02-21 ENCOUNTER — Telehealth: Payer: Self-pay

## 2019-02-21 NOTE — Telephone Encounter (Signed)
I left a message for the patient to return my call about scheduling appointment with Dr. Johnsie Cancel.

## 2019-04-01 DIAGNOSIS — H5213 Myopia, bilateral: Secondary | ICD-10-CM | POA: Diagnosis not present

## 2019-06-17 DIAGNOSIS — Z1231 Encounter for screening mammogram for malignant neoplasm of breast: Secondary | ICD-10-CM | POA: Diagnosis not present

## 2019-07-10 DIAGNOSIS — R69 Illness, unspecified: Secondary | ICD-10-CM | POA: Diagnosis not present

## 2019-07-22 DIAGNOSIS — H60311 Diffuse otitis externa, right ear: Secondary | ICD-10-CM | POA: Diagnosis not present

## 2019-07-22 DIAGNOSIS — H7192 Unspecified cholesteatoma, left ear: Secondary | ICD-10-CM | POA: Diagnosis not present

## 2019-07-22 DIAGNOSIS — H66003 Acute suppurative otitis media without spontaneous rupture of ear drum, bilateral: Secondary | ICD-10-CM | POA: Diagnosis not present

## 2019-07-31 DIAGNOSIS — H66001 Acute suppurative otitis media without spontaneous rupture of ear drum, right ear: Secondary | ICD-10-CM | POA: Diagnosis not present

## 2019-07-31 DIAGNOSIS — J309 Allergic rhinitis, unspecified: Secondary | ICD-10-CM | POA: Diagnosis not present

## 2019-07-31 DIAGNOSIS — H60311 Diffuse otitis externa, right ear: Secondary | ICD-10-CM | POA: Diagnosis not present

## 2019-08-16 DIAGNOSIS — L570 Actinic keratosis: Secondary | ICD-10-CM | POA: Diagnosis not present

## 2019-08-16 DIAGNOSIS — D1801 Hemangioma of skin and subcutaneous tissue: Secondary | ICD-10-CM | POA: Diagnosis not present

## 2019-08-16 DIAGNOSIS — L821 Other seborrheic keratosis: Secondary | ICD-10-CM | POA: Diagnosis not present

## 2019-08-18 DIAGNOSIS — H906 Mixed conductive and sensorineural hearing loss, bilateral: Secondary | ICD-10-CM | POA: Insufficient documentation

## 2019-08-21 DIAGNOSIS — M797 Fibromyalgia: Secondary | ICD-10-CM | POA: Insufficient documentation

## 2019-08-21 DIAGNOSIS — M2550 Pain in unspecified joint: Secondary | ICD-10-CM | POA: Insufficient documentation

## 2019-08-21 DIAGNOSIS — H6532 Chronic mucoid otitis media, left ear: Secondary | ICD-10-CM | POA: Insufficient documentation

## 2019-08-21 DIAGNOSIS — H906 Mixed conductive and sensorineural hearing loss, bilateral: Secondary | ICD-10-CM | POA: Diagnosis not present

## 2019-08-21 DIAGNOSIS — I10 Essential (primary) hypertension: Secondary | ICD-10-CM | POA: Insufficient documentation

## 2019-11-09 DIAGNOSIS — Z20828 Contact with and (suspected) exposure to other viral communicable diseases: Secondary | ICD-10-CM | POA: Diagnosis not present

## 2019-11-26 DIAGNOSIS — H906 Mixed conductive and sensorineural hearing loss, bilateral: Secondary | ICD-10-CM | POA: Diagnosis not present

## 2019-12-18 DIAGNOSIS — R94128 Abnormal results of other function studies of ear and other special senses: Secondary | ICD-10-CM | POA: Diagnosis not present

## 2019-12-18 DIAGNOSIS — H906 Mixed conductive and sensorineural hearing loss, bilateral: Secondary | ICD-10-CM | POA: Diagnosis not present

## 2019-12-18 DIAGNOSIS — K046 Periapical abscess with sinus: Secondary | ICD-10-CM | POA: Diagnosis not present

## 2019-12-18 DIAGNOSIS — J320 Chronic maxillary sinusitis: Secondary | ICD-10-CM | POA: Diagnosis not present

## 2019-12-18 DIAGNOSIS — Z8669 Personal history of other diseases of the nervous system and sense organs: Secondary | ICD-10-CM | POA: Diagnosis not present

## 2019-12-18 DIAGNOSIS — K027 Dental root caries: Secondary | ICD-10-CM | POA: Insufficient documentation

## 2020-01-05 DIAGNOSIS — K046 Periapical abscess with sinus: Secondary | ICD-10-CM | POA: Diagnosis not present

## 2020-01-05 DIAGNOSIS — J320 Chronic maxillary sinusitis: Secondary | ICD-10-CM | POA: Diagnosis not present

## 2020-01-20 DIAGNOSIS — M797 Fibromyalgia: Secondary | ICD-10-CM | POA: Diagnosis not present

## 2020-01-20 DIAGNOSIS — I10 Essential (primary) hypertension: Secondary | ICD-10-CM | POA: Diagnosis not present

## 2020-01-20 DIAGNOSIS — E538 Deficiency of other specified B group vitamins: Secondary | ICD-10-CM | POA: Diagnosis not present

## 2020-01-20 DIAGNOSIS — E039 Hypothyroidism, unspecified: Secondary | ICD-10-CM | POA: Diagnosis not present

## 2020-01-20 DIAGNOSIS — R5382 Chronic fatigue, unspecified: Secondary | ICD-10-CM | POA: Diagnosis not present

## 2020-01-28 DIAGNOSIS — I10 Essential (primary) hypertension: Secondary | ICD-10-CM | POA: Diagnosis not present

## 2020-01-28 DIAGNOSIS — E538 Deficiency of other specified B group vitamins: Secondary | ICD-10-CM | POA: Diagnosis not present

## 2020-01-28 DIAGNOSIS — Z13220 Encounter for screening for lipoid disorders: Secondary | ICD-10-CM | POA: Diagnosis not present

## 2020-01-28 DIAGNOSIS — E039 Hypothyroidism, unspecified: Secondary | ICD-10-CM | POA: Diagnosis not present

## 2020-02-21 ENCOUNTER — Other Ambulatory Visit: Payer: Self-pay | Admitting: Family Medicine

## 2020-03-12 ENCOUNTER — Other Ambulatory Visit: Payer: Self-pay | Admitting: Sports Medicine

## 2020-03-12 ENCOUNTER — Ambulatory Visit (INDEPENDENT_AMBULATORY_CARE_PROVIDER_SITE_OTHER): Payer: Medicare HMO

## 2020-03-12 ENCOUNTER — Other Ambulatory Visit: Payer: Self-pay

## 2020-03-12 ENCOUNTER — Encounter: Payer: Self-pay | Admitting: Sports Medicine

## 2020-03-12 ENCOUNTER — Ambulatory Visit (INDEPENDENT_AMBULATORY_CARE_PROVIDER_SITE_OTHER): Payer: Medicare HMO | Admitting: Sports Medicine

## 2020-03-12 DIAGNOSIS — M216X2 Other acquired deformities of left foot: Secondary | ICD-10-CM

## 2020-03-12 DIAGNOSIS — M722 Plantar fascial fibromatosis: Secondary | ICD-10-CM

## 2020-03-12 DIAGNOSIS — H6983 Other specified disorders of Eustachian tube, bilateral: Secondary | ICD-10-CM | POA: Insufficient documentation

## 2020-03-12 DIAGNOSIS — M79672 Pain in left foot: Secondary | ICD-10-CM

## 2020-03-12 MED ORDER — TRIAMCINOLONE ACETONIDE 10 MG/ML IJ SUSP
10.0000 mg | Freq: Once | INTRAMUSCULAR | Status: AC
  Administered 2020-03-12: 21:00:00 10 mg

## 2020-03-12 NOTE — Progress Notes (Addendum)
Subjective: Sabrina Snyder is a 59 y.o. female patient presents to office with complaint of moderate heel pain on the left. Patient admits to post static dyskinesia for a couple of months in duration. 5/10 sharp pain worse when flexing and prolonged walking especially when foot it flat with a burning pain to the area. Patient has treated this problem with stretching with no relief. Denies any other pedal complaints.   Review of Systems  All other systems reviewed and are negative.    Patient Active Problem List   Diagnosis Date Noted  . Eustachian tube dysfunction, bilateral 03/12/2020  . Dental root caries 12/18/2019  . Maxillary sinusitis, chronic 12/18/2019  . Chronic secretory otitis media of left ear 08/21/2019  . Fibromyalgia 08/21/2019  . Hypertension 08/21/2019  . Joint pain 08/21/2019  . Mixed conductive and sensorineural hearing loss of both ears 08/18/2019  . Obstructive sleep apnea syndrome 01/11/2017  . Memory loss 01/11/2017  . Chronic fatigue 01/11/2017    Current Outpatient Medications on File Prior to Visit  Medication Sig Dispense Refill  . ALPRAZolam (XANAX) 0.25 MG tablet Take 0.25 mg by mouth daily as needed for anxiety.    [name] buPROPion (WELLBUTRIN SR) 150 MG 12 hr tablet Take 1 tablet (150 mg total) by mouth 2 (two) times daily.    . cyanocobalamin (,VITAMIN B-12,) 1000 MCG/ML injection as directed.  11  . DULoxetine (CYMBALTA) 60 MG capsule TAKE 1 CAPSULE ONCE A DAY BY MOUTH DAILY 90 capsule 1  . estrogens, conjugated, (PREMARIN) 0.625 MG tablet Take 1 tablet (0.625 mg total) by mouth daily. Take daily for 21 days then do not take for 7 days.    . hydrochlorothiazide (HYDRODIURIL) 25 MG tablet Take 1 tablet (25 mg total) by mouth daily.    . IRON PO Take 1 tablet by mouth daily.    [name] levothyroxine (SYNTHROID, LEVOTHROID) 200 MCG tablet Take 200 mcg by mouth daily before breakfast.    . losartan (COZAAR) 100 MG tablet Take 100 mg by mouth daily.    . Omega-3  Fatty Acids (SALMON OIL-1000 PO) Take 1 tablet by mouth daily.    . traMADol (ULTRAM) 50 MG tablet Take 1 tablet (50 mg total) by mouth every 6 (six) hours as needed. 30 tablet 0   No current facility-administered medications on file prior to visit.    Allergies  Allergen Reactions  . Codeine Nausea And Vomiting  . Sulfa Antibiotics Other (See Comments)    Pt states the medication her very uncomfortable; break out in sweats; makes her feel bad Pt states the medication her very uncomfortable; break out in sweats; makes her feel bad Pt states the medication her very uncomfortable; break out in sweats; makes her feel bad     Objective: Physical Exam General: The patient is alert and oriented x3 in no acute distress.  Dermatology: Skin is warm, dry and supple bilateral lower extremities. Nails 1-10 are normal. There is no erythema, edema, no eccymosis, no open lesions present. Integument is otherwise unremarkable.  Vascular: Dorsalis Pedis pulse and Posterior Tibial pulse are 1/4 bilateral. Capillary fill time is immediate to all digits.  Neurological: Grossly intact to light touch with an achilles reflex of +2/5 and a  negative Tinel's sign bilateral.  Musculoskeletal: Tenderness to palpation at the medial calcaneal tubercale and through the insertion of the plantar fascia on the left foot. Limited ankle range of motion left>right.  Strength 5/5 in all groups bilateral.   Gait: Unassisted, Antalgic  avoid weight on left heel  Xray, Left foot:  Normal osseous mineralization. Joint spaces preserved. No fracture/dislocation/boney destruction. Calcaneal spur present with mild thickening of plantar fascia. No other soft tissue abnormalities or radiopaque foreign bodies.   Assessment and Plan: Problem List Items Addressed This Visit    None    Visit Diagnoses    Plantar fasciitis of left foot    -  Primary   Pain of left heel       Acquired equinus deformity of left foot          -Complete examination performed.  -Xrays reviewed -Discussed with patient in detail the condition of plantar fasciitis, how this occurs and general treatment options. Explained both conservative and surgical treatments.  -After oral consent and aseptic prep, injected a mixture containing 1 ml of 2%  plain lidocaine, 1 ml 0.5% plain marcaine, 0.5 ml of kenalog 10 and 0.5 ml of dexamethasone phosphate into left heel. Post-injection care discussed with patient.  -Dispensed heel lifts bilateral  -Explained and dispensed to patient daily stretching exercises. -Recommend patient to ice affected area 1-2x daily. -Patient to return to office in 4 weeks for follow up or sooner if problems or questions arise.  Landis Martins, DPM

## 2020-04-01 ENCOUNTER — Other Ambulatory Visit: Payer: Self-pay | Admitting: Family Medicine

## 2020-04-05 DIAGNOSIS — R05 Cough: Secondary | ICD-10-CM | POA: Diagnosis not present

## 2020-04-05 DIAGNOSIS — R062 Wheezing: Secondary | ICD-10-CM | POA: Diagnosis not present

## 2020-04-05 DIAGNOSIS — Z20822 Contact with and (suspected) exposure to covid-19: Secondary | ICD-10-CM | POA: Diagnosis not present

## 2020-04-09 ENCOUNTER — Ambulatory Visit: Payer: Medicare HMO | Admitting: Sports Medicine

## 2020-04-13 DIAGNOSIS — R0600 Dyspnea, unspecified: Secondary | ICD-10-CM | POA: Diagnosis not present

## 2020-04-13 DIAGNOSIS — R05 Cough: Secondary | ICD-10-CM | POA: Diagnosis not present

## 2020-04-13 DIAGNOSIS — R9389 Abnormal findings on diagnostic imaging of other specified body structures: Secondary | ICD-10-CM | POA: Diagnosis not present

## 2020-04-13 DIAGNOSIS — J22 Unspecified acute lower respiratory infection: Secondary | ICD-10-CM | POA: Diagnosis not present

## 2020-04-14 DIAGNOSIS — J22 Unspecified acute lower respiratory infection: Secondary | ICD-10-CM | POA: Diagnosis not present

## 2020-04-14 DIAGNOSIS — R05 Cough: Secondary | ICD-10-CM | POA: Diagnosis not present

## 2020-06-01 DIAGNOSIS — K046 Periapical abscess with sinus: Secondary | ICD-10-CM | POA: Diagnosis not present

## 2020-06-04 DIAGNOSIS — J0100 Acute maxillary sinusitis, unspecified: Secondary | ICD-10-CM | POA: Diagnosis not present

## 2020-06-17 DIAGNOSIS — Z1231 Encounter for screening mammogram for malignant neoplasm of breast: Secondary | ICD-10-CM | POA: Diagnosis not present

## 2020-06-25 ENCOUNTER — Encounter: Payer: Self-pay | Admitting: Sports Medicine

## 2020-06-25 ENCOUNTER — Other Ambulatory Visit: Payer: Self-pay

## 2020-06-25 ENCOUNTER — Ambulatory Visit (INDEPENDENT_AMBULATORY_CARE_PROVIDER_SITE_OTHER): Payer: Medicare HMO | Admitting: Sports Medicine

## 2020-06-25 DIAGNOSIS — M216X2 Other acquired deformities of left foot: Secondary | ICD-10-CM

## 2020-06-25 DIAGNOSIS — M722 Plantar fascial fibromatosis: Secondary | ICD-10-CM

## 2020-06-25 DIAGNOSIS — M79672 Pain in left foot: Secondary | ICD-10-CM

## 2020-06-25 MED ORDER — TRIAMCINOLONE ACETONIDE 40 MG/ML IJ SUSP
20.0000 mg | Freq: Once | INTRAMUSCULAR | Status: DC
Start: 2020-06-25 — End: 2022-07-31

## 2020-06-25 NOTE — Progress Notes (Signed)
Subjective: Sabrina Snyder is a 59 y.o. female patient returns office for follow-up evaluation of left heel pain.  Patient reports that her was doing good and about 3 weeks ago got flared back up also started having pain radiating up the back of the heel.  Patient reports that icing helps.  Patient denies any other pedal complaints at this time.  Patient Active Problem List   Diagnosis Date Noted  . Eustachian tube dysfunction, bilateral 03/12/2020  . Dental root caries 12/18/2019  . Maxillary sinusitis, chronic 12/18/2019  . Chronic secretory otitis media of left ear 08/21/2019  . Fibromyalgia 08/21/2019  . Hypertension 08/21/2019  . Joint pain 08/21/2019  . Mixed conductive and sensorineural hearing loss of both ears 08/18/2019  . Obstructive sleep apnea syndrome 01/11/2017  . Memory loss 01/11/2017  . Chronic fatigue 01/11/2017    Current Outpatient Medications on File Prior to Visit  Medication Sig Dispense Refill  . ALPRAZolam (XANAX) 0.25 MG tablet Take 0.25 mg by mouth daily as needed for anxiety.    [name] buPROPion (WELLBUTRIN SR) 150 MG 12 hr tablet Take 1 tablet (150 mg total) by mouth 2 (two) times daily.    . cyanocobalamin (,VITAMIN B-12,) 1000 MCG/ML injection as directed.  11  . DULoxetine (CYMBALTA) 60 MG capsule TAKE 1 CAPSULE ONCE A DAY BY MOUTH DAILY 90 capsule 1  . estrogens, conjugated, (PREMARIN) 0.625 MG tablet Take 1 tablet (0.625 mg total) by mouth daily. Take daily for 21 days then do not take for 7 days.    . hydrochlorothiazide (HYDRODIURIL) 25 MG tablet TAKE 1 TABLET BY MOUTH EVERY DAY 90 tablet 0  . IRON PO Take 1 tablet by mouth daily.    [name] levothyroxine (SYNTHROID, LEVOTHROID) 200 MCG tablet Take 200 mcg by mouth daily before breakfast.    . losartan (COZAAR) 100 MG tablet Take 100 mg by mouth daily.    . Omega-3 Fatty Acids (SALMON OIL-1000 PO) Take 1 tablet by mouth daily.    . traMADol (ULTRAM) 50 MG tablet Take 1 tablet (50 mg total) by mouth every 6  (six) hours as needed. 30 tablet 0   No current facility-administered medications on file prior to visit.    Allergies  Allergen Reactions  . Codeine Nausea And Vomiting  . Doxycycline Nausea And Vomiting    Nausea and vomiting  . Sulfa Antibiotics Other (See Comments)    Pt states the medication her very uncomfortable; break out in sweats; makes her feel bad Pt states the medication her very uncomfortable; break out in sweats; makes her feel bad Pt states the medication her very uncomfortable; break out in sweats; makes her feel bad     Objective: Physical Exam General: The patient is alert and oriented x3 in no acute distress.  Dermatology: Skin is warm, dry and supple bilateral lower extremities. Nails 1-10 are normal. There is no erythema, edema, no eccymosis, no open lesions present. Integument is otherwise unremarkable.  Vascular: Dorsalis Pedis pulse and Posterior Tibial pulse are 1/4 bilateral. Capillary fill time is immediate to all digits.  Neurological: Grossly intact to light touch with an achilles reflex of +2/5 and a  negative Tinel's sign bilateral.  Musculoskeletal: Tenderness to palpation at the medial calcaneal tubercale and through the insertion of the plantar fascia on the left foot. Limited ankle range of motion left.  Strength 5/5 in all groups bilateral.     Assessment and Plan: Problem List Items Addressed This Visit    None  Visit Diagnoses    Plantar fasciitis of left foot    -  Primary   Relevant Medications   triamcinolone acetonide (KENALOG-40) injection 20 mg (Start on 06/25/2020  2:45 PM)   Pain of left heel       Relevant Medications   triamcinolone acetonide (KENALOG-40) injection 20 mg (Start on 06/25/2020  2:45 PM)   Acquired equinus deformity of left foot         -Complete examination performed.  -Re-Discussed with patient in detail the condition of plantar fasciitis, how this occurs and general treatment options. Explained both  conservative and surgical treatments.  -After oral consent and aseptic prep, injected a mixture containing 1 ml of 2%  plain lidocaine, 1 ml 0.5% plain marcaine, 0.5 ml of kenalog 40 and 0.5 ml of dexamethasone phosphate into left heel. Post-injection care discussed with patient.  -Dispensed special brace and night splint to use as instructed -Explained and dispensed to patient daily stretching exercises. -Recommend patient to ice affected area 1-2x daily. -Patient to return to office if fails to continue to improve after 3 to 4 weeks or sooner if problems or questions arise.  Landis Martins, DPM

## 2020-07-04 ENCOUNTER — Other Ambulatory Visit: Payer: Self-pay | Admitting: Physician Assistant

## 2020-07-15 ENCOUNTER — Other Ambulatory Visit: Payer: Self-pay | Admitting: Physician Assistant

## 2020-07-15 DIAGNOSIS — R1011 Right upper quadrant pain: Secondary | ICD-10-CM | POA: Diagnosis not present

## 2020-07-15 DIAGNOSIS — Z23 Encounter for immunization: Secondary | ICD-10-CM | POA: Diagnosis not present

## 2020-07-16 ENCOUNTER — Other Ambulatory Visit: Payer: Self-pay | Admitting: Family Medicine

## 2020-07-16 DIAGNOSIS — R1011 Right upper quadrant pain: Secondary | ICD-10-CM

## 2020-07-20 ENCOUNTER — Ambulatory Visit
Admission: RE | Admit: 2020-07-20 | Discharge: 2020-07-20 | Disposition: A | Discharge status: Home / Self Care | Payer: Medicare HMO | Source: Ambulatory Visit | Attending: Family Medicine | Admitting: Family Medicine

## 2020-07-20 ENCOUNTER — Other Ambulatory Visit: Payer: Self-pay | Admitting: Family Medicine

## 2020-07-20 DIAGNOSIS — R1011 Right upper quadrant pain: Secondary | ICD-10-CM

## 2020-07-20 DIAGNOSIS — Z90710 Acquired absence of both cervix and uterus: Secondary | ICD-10-CM | POA: Diagnosis not present

## 2020-07-20 DIAGNOSIS — N200 Calculus of kidney: Secondary | ICD-10-CM | POA: Diagnosis not present

## 2020-07-23 ENCOUNTER — Other Ambulatory Visit: Payer: Medicare Other

## 2020-10-29 DIAGNOSIS — R079 Chest pain, unspecified: Secondary | ICD-10-CM | POA: Diagnosis not present

## 2020-10-29 DIAGNOSIS — R0781 Pleurodynia: Secondary | ICD-10-CM | POA: Diagnosis not present

## 2020-10-29 DIAGNOSIS — R7989 Other specified abnormal findings of blood chemistry: Secondary | ICD-10-CM | POA: Diagnosis not present

## 2020-12-23 DIAGNOSIS — H7311 Chronic myringitis, right ear: Secondary | ICD-10-CM | POA: Diagnosis not present

## 2020-12-23 DIAGNOSIS — H903 Sensorineural hearing loss, bilateral: Secondary | ICD-10-CM | POA: Diagnosis not present

## 2020-12-23 DIAGNOSIS — K047 Periapical abscess without sinus: Secondary | ICD-10-CM | POA: Diagnosis not present

## 2020-12-23 DIAGNOSIS — H6983 Other specified disorders of Eustachian tube, bilateral: Secondary | ICD-10-CM | POA: Diagnosis not present

## 2020-12-23 DIAGNOSIS — H906 Mixed conductive and sensorineural hearing loss, bilateral: Secondary | ICD-10-CM | POA: Diagnosis not present

## 2020-12-24 DIAGNOSIS — I10 Essential (primary) hypertension: Secondary | ICD-10-CM | POA: Diagnosis not present

## 2021-01-18 DIAGNOSIS — L509 Urticaria, unspecified: Secondary | ICD-10-CM | POA: Diagnosis not present

## 2021-01-18 DIAGNOSIS — E039 Hypothyroidism, unspecified: Secondary | ICD-10-CM | POA: Diagnosis not present

## 2021-01-18 DIAGNOSIS — Z5181 Encounter for therapeutic drug level monitoring: Secondary | ICD-10-CM | POA: Diagnosis not present

## 2021-01-25 DIAGNOSIS — N898 Other specified noninflammatory disorders of vagina: Secondary | ICD-10-CM | POA: Diagnosis not present

## 2021-02-06 DIAGNOSIS — Z20822 Contact with and (suspected) exposure to covid-19: Secondary | ICD-10-CM | POA: Diagnosis not present

## 2021-02-06 DIAGNOSIS — J029 Acute pharyngitis, unspecified: Secondary | ICD-10-CM | POA: Diagnosis not present

## 2021-02-06 DIAGNOSIS — J069 Acute upper respiratory infection, unspecified: Secondary | ICD-10-CM | POA: Diagnosis not present

## 2021-02-07 ENCOUNTER — Other Ambulatory Visit: Payer: Self-pay | Admitting: Family Medicine

## 2021-03-02 DIAGNOSIS — Z888 Allergy status to other drugs, medicaments and biological substances status: Secondary | ICD-10-CM | POA: Diagnosis not present

## 2021-03-02 DIAGNOSIS — Z885 Allergy status to narcotic agent status: Secondary | ICD-10-CM | POA: Diagnosis not present

## 2021-03-02 DIAGNOSIS — Z881 Allergy status to other antibiotic agents status: Secondary | ICD-10-CM | POA: Diagnosis not present

## 2021-03-02 DIAGNOSIS — K046 Periapical abscess with sinus: Secondary | ICD-10-CM | POA: Diagnosis not present

## 2021-03-02 DIAGNOSIS — R0981 Nasal congestion: Secondary | ICD-10-CM | POA: Diagnosis not present

## 2021-03-02 DIAGNOSIS — Z882 Allergy status to sulfonamides status: Secondary | ICD-10-CM | POA: Diagnosis not present

## 2021-03-02 DIAGNOSIS — J301 Allergic rhinitis due to pollen: Secondary | ICD-10-CM | POA: Diagnosis not present

## 2021-03-15 ENCOUNTER — Other Ambulatory Visit: Payer: Self-pay | Admitting: Family Medicine

## 2021-04-17 ENCOUNTER — Other Ambulatory Visit: Payer: Self-pay | Admitting: Family Medicine

## 2021-04-21 DIAGNOSIS — M5136 Other intervertebral disc degeneration, lumbar region: Secondary | ICD-10-CM | POA: Diagnosis not present

## 2021-05-04 ENCOUNTER — Other Ambulatory Visit: Payer: Medicare HMO | Admitting: *Deleted

## 2021-05-04 NOTE — Patient Outreach (Signed)
[facility]) Care Management  05/04/2021  IVY PURYEAR May 03, 1961 [id number]  Unsuccessful outreach attempt made to patient. RN Health Coach left HIPAA compliant voicemail message along with her contact information.  Plan: RN Health Coach will call patient within the next several business days and will send an unsuccessful letter.  Emelia Loron RN, BSN [place] [phone] [email or web address]

## 2021-05-05 DIAGNOSIS — M545 Low back pain, unspecified: Secondary | ICD-10-CM | POA: Diagnosis not present

## 2021-05-05 DIAGNOSIS — M4310 Spondylolisthesis, site unspecified: Secondary | ICD-10-CM | POA: Diagnosis not present

## 2021-05-05 DIAGNOSIS — M5136 Other intervertebral disc degeneration, lumbar region: Secondary | ICD-10-CM | POA: Diagnosis not present

## 2021-05-06 ENCOUNTER — Other Ambulatory Visit: Payer: Medicare HMO | Admitting: *Deleted

## 2021-05-06 NOTE — Patient Outreach (Signed)
[place] [facility]) Care Management  05/06/2021  MAILA DUKES Jul 24, 1961 [id number]  Unsuccessful outreach attempt made to patient. RN Health Coach left HIPAA compliant voicemail message along with her contact information.  Plan: RN Health Coach will call patient within the next several business days.  Emelia Loron RN, BSN [place] [phone] [email or web address]

## 2021-05-09 ENCOUNTER — Other Ambulatory Visit: Payer: Self-pay | Admitting: *Deleted

## 2021-05-09 NOTE — Patient Outreach (Signed)
[place] [facility]) Care Management  05/09/2021  Sabrina Snyder 1960-12-29 [id number]  Unsuccessful outreach attempt made to patient for #3 screening call. RN Health Coach left HIPAA compliant voicemail message along with her contact information.  Plan: RN Health Coach will call patient within the month of August.  Emelia Loron RN, BSN [place] [phone] [email or web address]

## 2021-05-10 ENCOUNTER — Ambulatory Visit: Payer: Self-pay | Admitting: *Deleted

## 2021-06-09 DIAGNOSIS — M5416 Radiculopathy, lumbar region: Secondary | ICD-10-CM | POA: Diagnosis not present

## 2021-06-16 DIAGNOSIS — E039 Hypothyroidism, unspecified: Secondary | ICD-10-CM | POA: Diagnosis not present

## 2021-06-16 DIAGNOSIS — I10 Essential (primary) hypertension: Secondary | ICD-10-CM | POA: Diagnosis not present

## 2021-06-16 DIAGNOSIS — E782 Mixed hyperlipidemia: Secondary | ICD-10-CM | POA: Diagnosis not present

## 2021-06-23 ENCOUNTER — Other Ambulatory Visit: Payer: Self-pay | Admitting: *Deleted

## 2021-06-23 NOTE — Patient Outreach (Signed)
[place] [facility]) Care Management  06/23/2021  Sabrina Snyder March 05, 1961 [id number]  4 attempts to establish contact with patient for screening without success. No response from unsuccessful letter mailed to patient. Case is being closed at this time. RN Health Coach left HIPAA compliant message along with her contact information.  Plan: RN Health Coach will close case.  Emelia Loron RN, BSN [place] [phone] [email or web address]

## 2021-06-29 DIAGNOSIS — L820 Inflamed seborrheic keratosis: Secondary | ICD-10-CM | POA: Diagnosis not present

## 2021-06-29 DIAGNOSIS — L728 Other follicular cysts of the skin and subcutaneous tissue: Secondary | ICD-10-CM | POA: Diagnosis not present

## 2021-06-29 DIAGNOSIS — D2239 Melanocytic nevi of other parts of face: Secondary | ICD-10-CM | POA: Diagnosis not present

## 2021-06-29 DIAGNOSIS — D225 Melanocytic nevi of trunk: Secondary | ICD-10-CM | POA: Diagnosis not present

## 2021-07-01 DIAGNOSIS — Z1231 Encounter for screening mammogram for malignant neoplasm of breast: Secondary | ICD-10-CM | POA: Diagnosis not present

## 2021-07-27 DIAGNOSIS — R6889 Other general symptoms and signs: Secondary | ICD-10-CM | POA: Diagnosis not present

## 2021-07-27 DIAGNOSIS — Z20822 Contact with and (suspected) exposure to covid-19: Secondary | ICD-10-CM | POA: Diagnosis not present

## 2021-07-27 DIAGNOSIS — U071 COVID-19: Secondary | ICD-10-CM | POA: Diagnosis not present

## 2021-08-11 ENCOUNTER — Other Ambulatory Visit: Payer: Self-pay | Admitting: Family Medicine

## 2021-08-27 ENCOUNTER — Ambulatory Visit (HOSPITAL_BASED_OUTPATIENT_CLINIC_OR_DEPARTMENT_OTHER): Payer: Self-pay | Admitting: Psychiatry

## 2021-08-27 ENCOUNTER — Other Ambulatory Visit: Payer: Self-pay

## 2021-08-27 DIAGNOSIS — Z91199 Patient's noncompliance with other medical treatment and regimen due to unspecified reason: Secondary | ICD-10-CM

## 2021-08-29 DIAGNOSIS — R69 Illness, unspecified: Secondary | ICD-10-CM | POA: Diagnosis not present

## 2021-08-29 DIAGNOSIS — E782 Mixed hyperlipidemia: Secondary | ICD-10-CM | POA: Diagnosis not present

## 2021-08-29 DIAGNOSIS — I10 Essential (primary) hypertension: Secondary | ICD-10-CM | POA: Diagnosis not present

## 2021-08-29 DIAGNOSIS — E039 Hypothyroidism, unspecified: Secondary | ICD-10-CM | POA: Diagnosis not present

## 2021-09-03 ENCOUNTER — Ambulatory Visit (HOSPITAL_COMMUNITY): Payer: Medicare HMO | Admitting: Psychiatry

## 2021-09-12 DIAGNOSIS — M5416 Radiculopathy, lumbar region: Secondary | ICD-10-CM | POA: Diagnosis not present

## 2021-10-01 ENCOUNTER — Ambulatory Visit (HOSPITAL_COMMUNITY): Payer: Medicare HMO | Admitting: Psychiatry

## 2021-10-10 ENCOUNTER — Ambulatory Visit: Payer: Self-pay | Admitting: Addiction (Substance Use Disorder)

## 2021-10-31 IMAGING — CT CT ABD-PELV W/O CM
2 of 4 series · 17 of 46 positions shown, 19 images · non-contrast
Comparison: 02/28/2017

CLINICAL DATA: Right upper quadrant pain. Elevated pancreatic
enzymes. Nephrolithiasis.

EXAM:
CT ABDOMEN AND PELVIS WITHOUT CONTRAST
TECHNIQUE: Multidetector CT imaging of the abdomen and pelvis was performed
following the standard protocol without IV contrast.

[Series 2: renal stone 5.00 br40 s3 axial · axial · 0.81mm/px · z∈[+1141,+1541]mm · 14 of 90 slices shown, 16 images]
[im 5/90  soft-tissue]
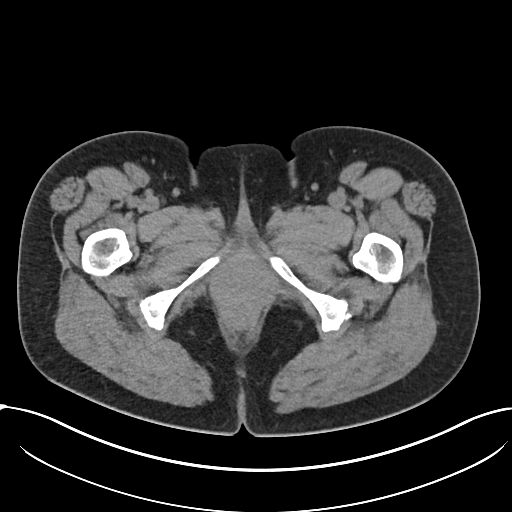
[im 5/90  bone]
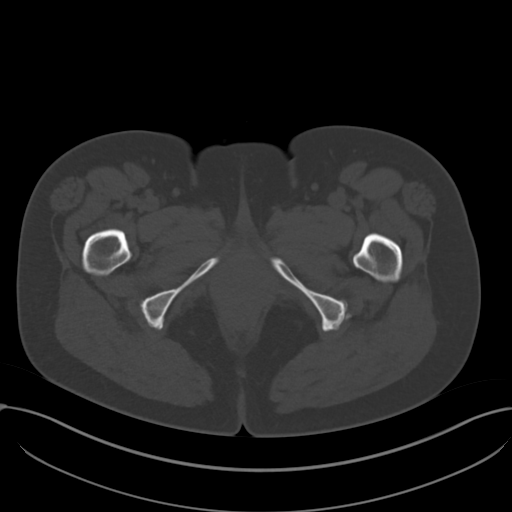
[im 10/90  soft-tissue]
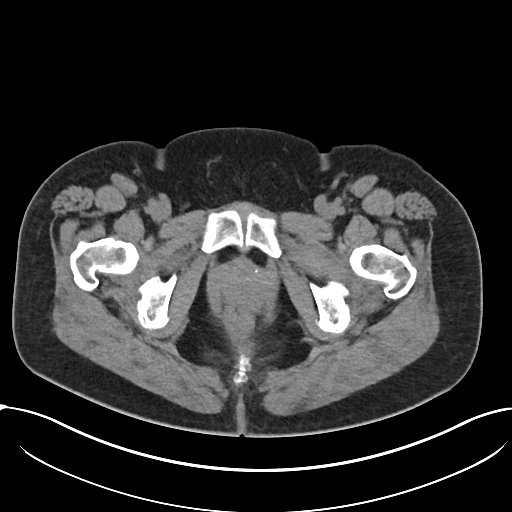
[im 20/90  soft-tissue]
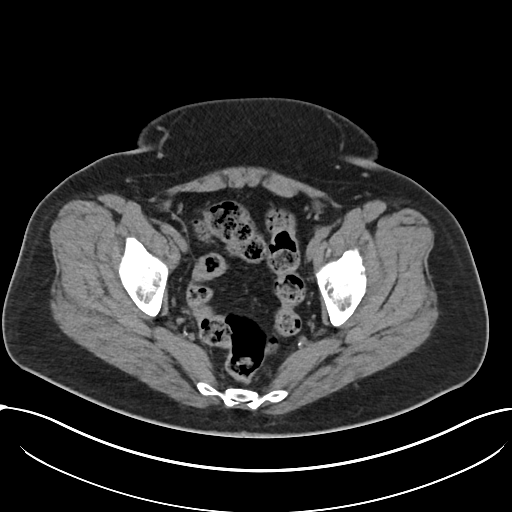
[im 25/90  soft-tissue]
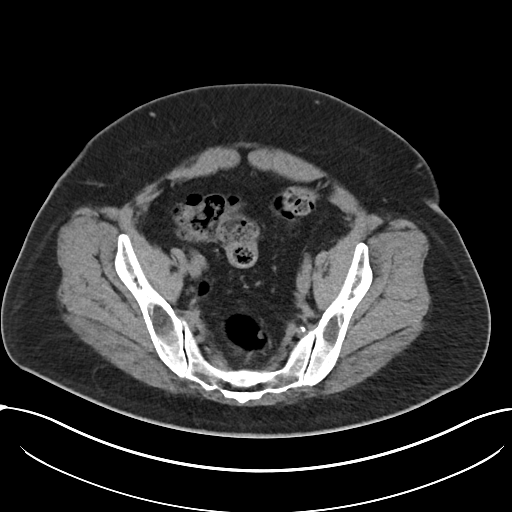
[im 30/90  soft-tissue]
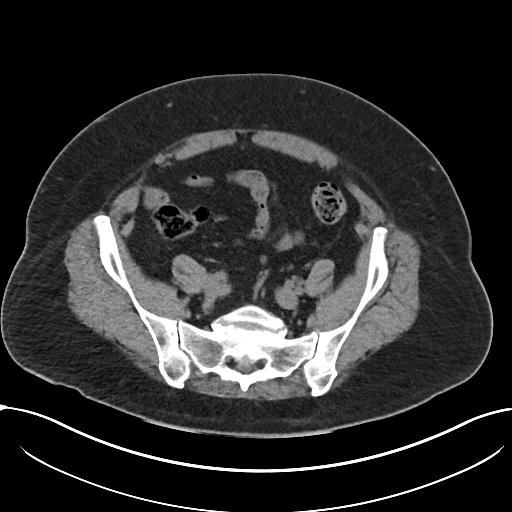
[im 35/90  soft-tissue]
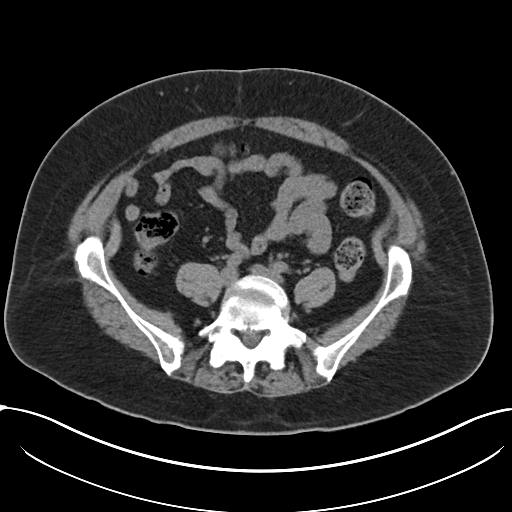
[im 40/90  soft-tissue]
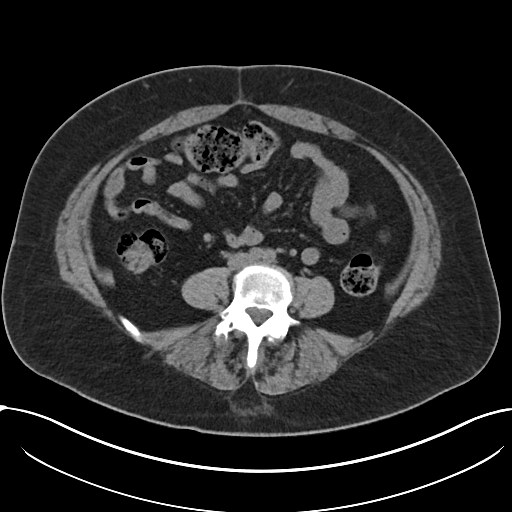
[im 50/90  soft-tissue]
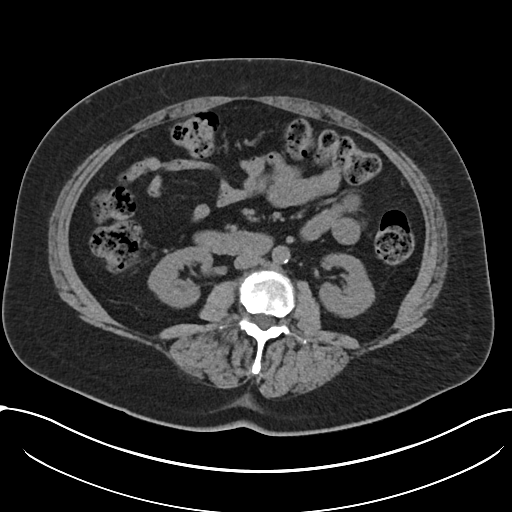
[im 55/90  soft-tissue]
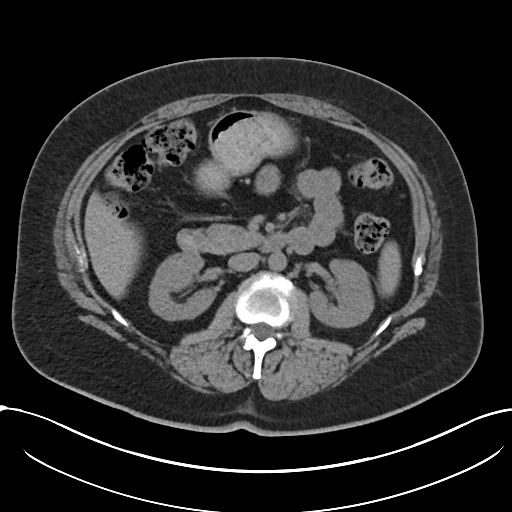
[im 55/90  bone]
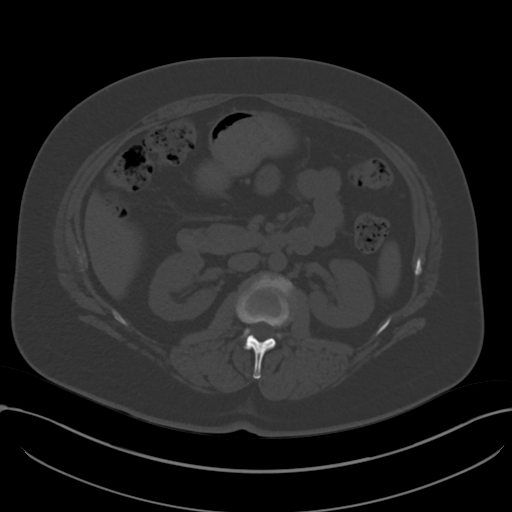
[im 60/90  soft-tissue]
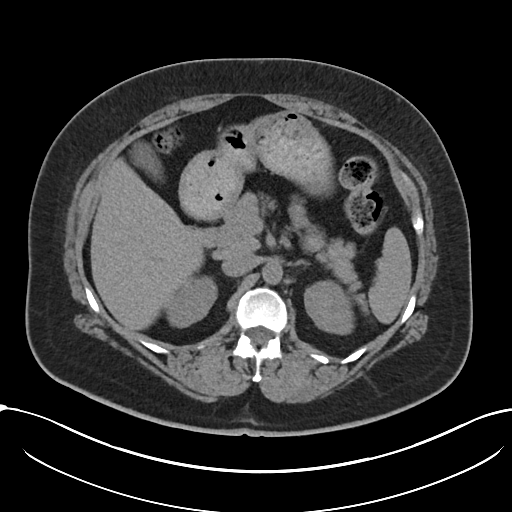
[im 65/90  soft-tissue]
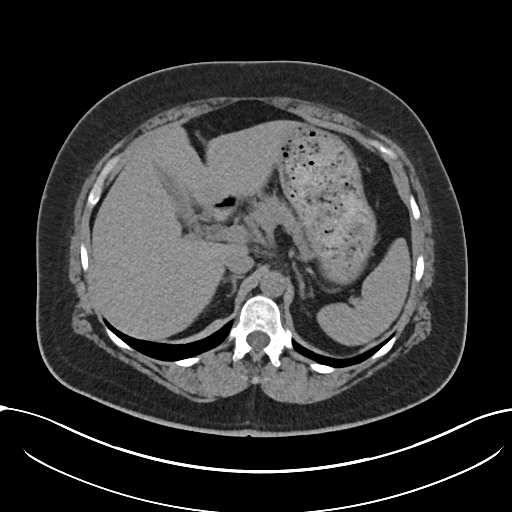
[im 70/90  soft-tissue]
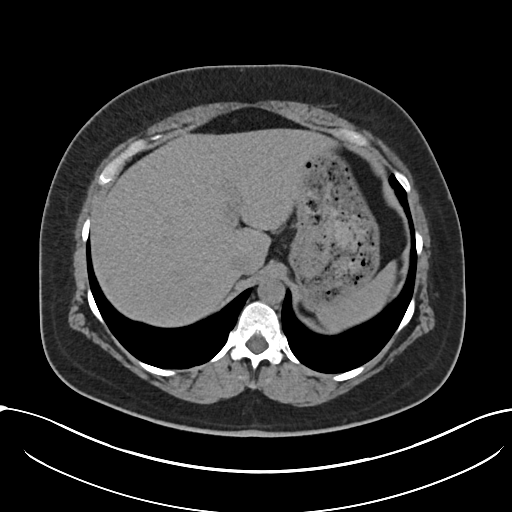
[im 80/90  soft-tissue]
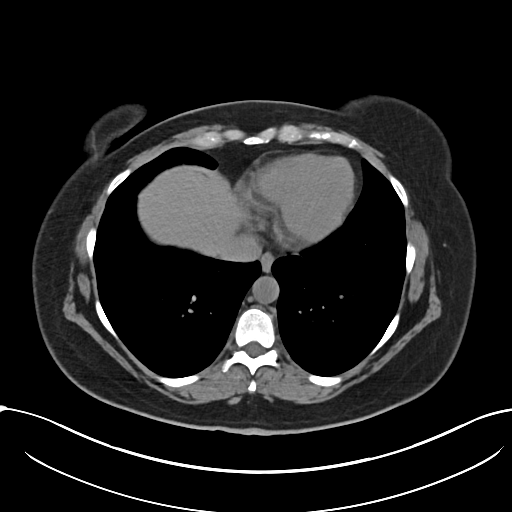
[im 85/90  soft-tissue]
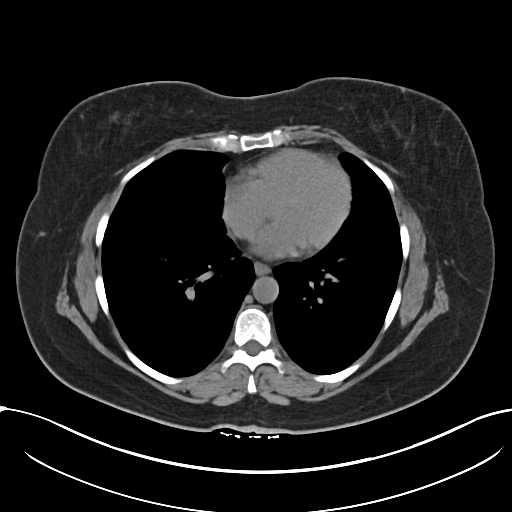

[Series 6: renal stone 2.00 br40 s3 cor · coronal · 0.81mm/px · 3 of 207 slices shown]
[im 69/207  soft-tissue]
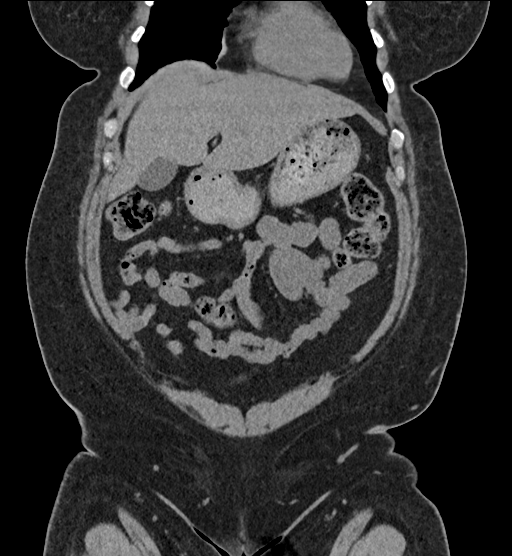
[im 92/207  soft-tissue]
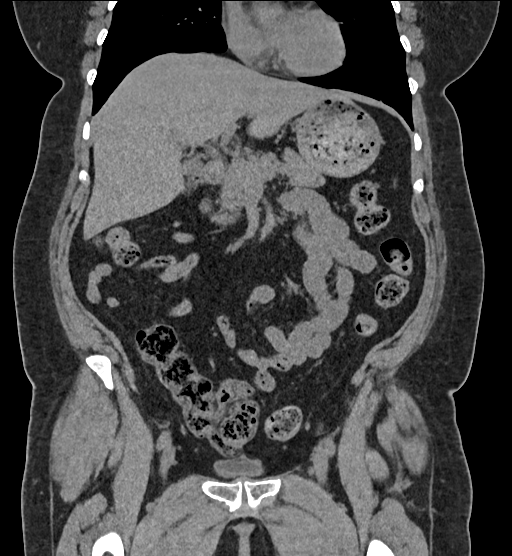
[im 115/207  soft-tissue]
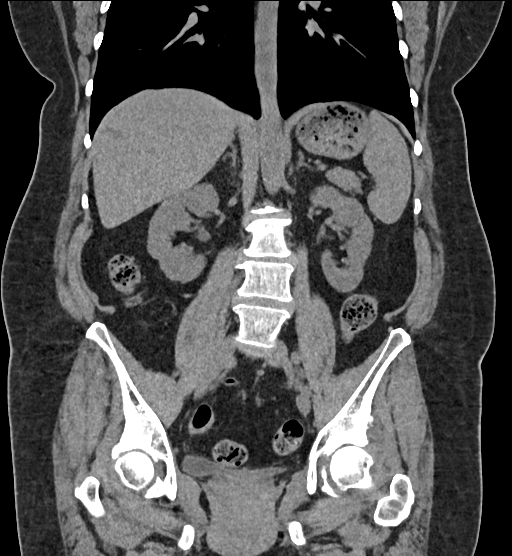

[17 of 46 positions shown; findings below may reference images not displayed]

FINDINGS: Lower chest: No acute findings.

Hepatobiliary: No mass visualized on this unenhanced exam.
Gallbladder is unremarkable. No evidence of biliary ductal
dilatation.

Pancreas: No mass or inflammatory process visualized on this
unenhanced exam.

Spleen:  Within normal limits in size.

Adrenals/Urinary tract: The multiple tiny calculi measuring up to 3
mm are again seen in both kidneys. No evidence of ureteral calculi
or hydronephrosis. Unremarkable unopacified urinary bladder.

Stomach/Bowel: No evidence of obstruction, inflammatory process, or
abnormal fluid collections. Normal appendix visualized.

Vascular/Lymphatic: No pathologically enlarged lymph nodes
identified. No evidence of abdominal aortic aneurysm. Aortic
atherosclerosis noted.

Reproductive: Prior hysterectomy noted. Adnexal regions are
unremarkable in appearance.

Other:  None.

Musculoskeletal:  No suspicious bone lesions identified.
IMPRESSION: Bilateral nephrolithiasis. No evidence of ureteral calculi,
hydronephrosis, or other acute findings.

Aortic Atherosclerosis ([id number]).

## 2021-11-02 DIAGNOSIS — R69 Illness, unspecified: Secondary | ICD-10-CM | POA: Diagnosis not present

## 2021-11-02 DIAGNOSIS — I10 Essential (primary) hypertension: Secondary | ICD-10-CM | POA: Diagnosis not present

## 2021-11-02 DIAGNOSIS — E039 Hypothyroidism, unspecified: Secondary | ICD-10-CM | POA: Diagnosis not present

## 2021-11-02 DIAGNOSIS — M797 Fibromyalgia: Secondary | ICD-10-CM | POA: Diagnosis not present

## 2021-11-22 NOTE — Progress Notes (Incomplete)
CARDIOLOGY CONSULT NOTE       Patient ID: Sabrina Snyder MRN: [id number] DOB/AGE: 60-22-1962 60 y.o.  Admit date: (Not on file) Referring Physician: Justin Mend Primary Physician: Maurice Small, MD Primary Cardiologist: New Reason for Consultation: CAD  Active Problems:   * No active hospital problems. *   HPI:  61 y.o. referred by DR Justin Mend for CAD and family history of CAD with father having MI and CABG in 81's Apparently he had a normal stress test prior to CABG She was seen had a normal lexiscan myovue August 2017 and April of 61  She has CFS and fibromyalgia Allergic to ACE with angioedema She had a cough and COVID positive in September   ***  ROS All other systems reviewed and negative except as noted above  Past Medical History:  Diagnosis Date   CFS (chronic fatigue syndrome)    Fibromyalgia    Hashimoto's disease    IDA (iron deficiency anemia)     Family History  Problem Relation Age of Onset   Atrial fibrillation Mother    Hypertension Mother    Pancreatic cancer Father    Heart disease Father    Hyperlipidemia Father    Diabetes Father    Hypertension Father    Hypertension Sister    Hypertension Brother    Schizophrenia Sister    Hypertension Sister     Social History   Socioeconomic History   Marital status: Legally Separated    Spouse name: Not on file   Number of children: Not on file   Years of education: Not on file   Highest education level: Not on file  Occupational History   Occupation: disabled  Tobacco Use   Smoking status: Never   Smokeless tobacco: Never  Substance and Sexual Activity   Alcohol use: No   Drug use: No   Sexual activity: Yes    Partners: Male    Birth control/protection: Surgical  Other Topics Concern   Not on file  Social History Narrative   Not on file   Social Determinants of Health   Financial Resource Strain: Not on file  Food Insecurity: Not on file  Transportation Needs: Not  on file  Physical Activity: Not on file  Stress: Not on file  Social Connections: Not on file  Intimate Partner Violence: Not on file    Past Surgical History:  Procedure Laterality Date   LAPAROSCOPIC TOTAL HYSTERECTOMY     thyroid ablation        Current Outpatient Medications:    ALPRAZolam (XANAX) 0.25 MG tablet, Take 0.25 mg by mouth daily as needed for anxiety., Disp: , Rfl:    buPROPion (WELLBUTRIN SR) 150 MG 12 hr tablet, Take 1 tablet (150 mg total) by mouth 2 (two) times daily., Disp: , Rfl:    cyanocobalamin (,VITAMIN B-12,) 1000 MCG/ML injection, as directed., Disp: , Rfl: 11   DULoxetine (CYMBALTA) 60 MG capsule, TAKE 1 CAPSULE ONCE A DAY BY MOUTH DAILY, Disp: 90 capsule, Rfl: 1   estrogens, conjugated, (PREMARIN) 0.625 MG tablet, Take 1 tablet (0.625 mg total) by mouth daily. Take daily for 21 days then do not take for 7 days., Disp: , Rfl:    hydrochlorothiazide (HYDRODIURIL) 25 MG tablet, TAKE 1 TABLET BY MOUTH EVERY DAY, Disp: 90 tablet, Rfl: 0   IRON PO, Take 1 tablet by mouth daily., Disp: , Rfl:    levothyroxine (SYNTHROID, LEVOTHROID) 200 MCG tablet, Take 200 mcg by mouth daily before breakfast., Disp: ,  Rfl:    losartan (COZAAR) 100 MG tablet, Take 100 mg by mouth daily., Disp: , Rfl:    Omega-3 Fatty Acids (SALMON OIL-1000 PO), Take 1 tablet by mouth daily., Disp: , Rfl:    traMADol (ULTRAM) 50 MG tablet, Take 1 tablet (50 mg total) by mouth every 6 (six) hours as needed., Disp: 30 tablet, Rfl: 0  Current Facility-Administered Medications:    triamcinolone acetonide (KENALOG-40) injection 20 mg, 20 mg, Other, Once, Stover, Titorya, DPM  triamcinolone acetonide  20 mg Other Once     Physical Exam: There were no vitals taken for this visit.   Affect appropriate Healthy:  appears stated age 61: normal Neck supple with no adenopathy JVP normal no bruits no thyromegaly Lungs clear with no wheezing and good diaphragmatic motion Heart:  S1/S2  no murmur, no rub, gallop or click PMI normal Abdomen: benighn, BS positve, no tenderness, no AAA no bruit.  No HSM or HJR Distal pulses intact with no bruits No edema Neuro non-focal Skin warm and dry No muscular weakness   Labs:  No results found for: WBC, HGB, HCT, MCV, PLT No results for input(s): NA, K, CL, CO2, BUN, CREATININE, CALCIUM, PROT, BILITOT, ALKPHOS, ALT, AST, GLUCOSE in the last 168 hours.  Invalid input(s): LABALBU No results found for: CKTOTAL, CKMB, CKMBINDEX, TROPONINI No results found for: CHOL No results found for: HDL No results found for: LDLCALC No results found for: TRIG No results found for: CHOLHDL No results found for: LDLDIRECT    Radiology: No results found.  EKG: 2019 SR rate 85 nonspecific ST changes    ASSESSMENT AND PLAN:   Family history CAD:  Father with MI /CABG 60's She has had normal myovue in 2017 and 2019 *** Fibromyalgia:  continue Cymbalta HTN:  stable on losartan Hypothyroid:  on synthroid replacement TSH with primary   ***  F/U PRN  Signed: Jenkins Rouge 11/22/2021, 1:53 PM

## 2021-12-01 ENCOUNTER — Ambulatory Visit: Payer: Medicare HMO | Admitting: Cardiovascular Disease

## 2021-12-02 DIAGNOSIS — G9519 Other vascular myelopathies: Secondary | ICD-10-CM | POA: Diagnosis not present

## 2021-12-02 DIAGNOSIS — M5136 Other intervertebral disc degeneration, lumbar region: Secondary | ICD-10-CM | POA: Diagnosis not present

## 2021-12-05 ENCOUNTER — Telehealth: Payer: Self-pay

## 2021-12-05 NOTE — Progress Notes (Deleted)
Virtual Cardiology Consult via Video Note   This visit type was conducted due to national recommendations for restrictions regarding the COVID-19 Pandemic (e.g. social distancing) in an effort to limit this patient's exposure and mitigate transmission in our community.  Due to her co-morbid illnesses, this patient is at least at moderate risk for complications without adequate follow up.  This format is felt to be most appropriate for this patient at this time.  All issues noted in this document were discussed and addressed.  A limited physical exam was performed with this format.  Please refer to the patient's chart for her consent to telehealth for [facility].     Date:  12/05/2021   ID:  Sabrina Snyder, DOB [birth year], MRN [id number] The patient was identified using 2 identifiers.  Patient Location: Home Provider Location: Home Office   PCP:  Maurice Small, MD   [facility] Providers Cardiologist:  Fransico Him, MD     Evaluation Performed:  Follow-Up Visit  Chief Complaint:  Palpitations  History of Present Illness:    Sabrina Snyder is a 61 y.o. female with with history of chronic fatigue syndrome on disability, fibromyalgia treated with Tramadol and Gabapentin.  Was seen in 2017 for atypical CP and normal myoview with EF 70% with hypertensive BP response to exercise.  Coronary Ca score was recommended due to family hx of MI (father in his 81's) but did not follow through.  She has been turned down for life insurance due to concern for CAD which has never been documented.  She was seen back by Dr. Johnsie Cancel 2019 and had no CP but coronary Ca score was reordered but no done.    She is now referred for evaluation of palpitations by her PCP Maurice Small, MD.  The patient does not have symptoms concerning for COVID-19 infection (fever, chills, cough, or new shortness of breath).    Past Medical History:  Diagnosis Date   CFS (chronic fatigue syndrome)    Fibromyalgia     Hashimoto's disease    IDA (iron deficiency anemia)    Past Surgical History:  Procedure Laterality Date   LAPAROSCOPIC TOTAL HYSTERECTOMY     thyroid ablation       No outpatient medications have been marked as taking for the 12/06/21 encounter (Appointment) with Sueanne Margarita, MD.   Current Facility-Administered Medications for the 12/06/21 encounter (Appointment) with Sueanne Margarita, MD  Medication   triamcinolone acetonide (KENALOG-40) injection 20 mg     Allergies:   Codeine, Doxycycline, and Sulfa antibiotics   Social History   Tobacco Use   Smoking status: Never   Smokeless tobacco: Never  Substance Use Topics   Alcohol use: No   Drug use: No     Family Hx: The patient's family history includes Atrial fibrillation in her mother; Diabetes in her father; Heart disease in her father; Hyperlipidemia in her father; Hypertension in her brother, father, mother, sister, and sister; Pancreatic cancer in her father; Schizophrenia in her sister.  ROS:   Please see the history of present illness.     All other systems reviewed and are negative.   Prior CV studies:   The following studies were reviewed today:  none  Labs/Other Tests and Data Reviewed:    EKG:  No ECG reviewed.  Recent Labs: No results found for requested labs within last 8760 hours.   Recent Lipid Panel No results found for: CHOL, TRIG, HDL, CHOLHDL, LDLCALC, LDLDIRECT  Wt Readings  from Last 3 Encounters:  02/14/18 166 lb 8 oz (75.5 kg)  01/11/17 178 lb 1.6 oz (80.8 kg)  01/04/17 176 lb 1.9 oz (79.9 kg)     Risk Assessment/Calculations:          Objective:    Vital Signs:  There were no vitals taken for this visit.   VITAL SIGNS:  reviewed GEN:  no acute distress EYES:  sclerae anicteric, EOMI - Extraocular Movements Intact RESPIRATORY:  normal respiratory effort, symmetric expansion CARDIOVASCULAR:  no peripheral edema SKIN:  no rash, lesions or ulcers. MUSCULOSKELETAL:  no obvious  deformities. NEURO:  alert and oriented x 3, no obvious focal deficit PSYCH:  normal affect  ASSESSMENT & PLAN:    Family hx of premature CAD -Her father had an MI in his 53s.   -Exercise Myoview in 2017 was normal -Coronary calcium score was recommended but she never followed through -I recommend she proceed with coronary calcium score so we can assess her future risk  2.  Palpitations -Unclear etiology -I will get a 30-day event monitor to assess for arrhythmias  3.  Hypertension -BP is adequately controlled on exam today. -Continue prescription drug management with HCTZ 25 mg daily, losartan 100 mg daily with as needed refills -There has been mention in some of the office notes that she has had angioedema from ACE inhibitor's in the past           COVID-19 Education: The signs and symptoms of COVID-19 were discussed with the patient and how to seek care for testing (follow up with PCP or arrange E-visit).  The importance of social distancing was discussed today.  Time:   Today, I have spent 20 minutes with the patient with telehealth technology discussing the above problems.     Medication Adjustments/Labs and Tests Ordered: Current medicines are reviewed at length with the patient today.  Concerns regarding medicines are outlined above.   Tests Ordered: No orders of the defined types were placed in this encounter.   Medication Changes: No orders of the defined types were placed in this encounter.   Follow Up:  prn pending results of coronary calcium score and event monitor  Signed, Fransico Him, MD  12/05/2021 4:53 PM    [facility]

## 2021-12-05 NOTE — Telephone Encounter (Signed)
°  Patient Consent for Virtual Visit        Sabrina Snyder has provided verbal consent on 12/05/2021 for a virtual visit (video or telephone).   CONSENT FOR VIRTUAL VISIT FOR:  Sabrina Snyder  By participating in this virtual visit I agree to the following:  I hereby voluntarily request, consent and authorize [facility] and its employed or contracted physicians, physician assistants, nurse practitioners or other licensed health care professionals (the Practitioner), to provide me with telemedicine health care services (the Services") as deemed necessary by the treating Practitioner. I acknowledge and consent to receive the Services by the Practitioner via telemedicine. I understand that the telemedicine visit will involve communicating with the Practitioner through live audiovisual communication technology and the disclosure of certain medical information by electronic transmission. I acknowledge that I have been given the opportunity to request an in-person assessment or other available alternative prior to the telemedicine visit and am voluntarily participating in the telemedicine visit.  I understand that I have the right to withhold or withdraw my consent to the use of telemedicine in the course of my care at any time, without affecting my right to future care or treatment, and that the Practitioner or I may terminate the telemedicine visit at any time. I understand that I have the right to inspect all information obtained and/or recorded in the course of the telemedicine visit and may receive copies of available information for a reasonable fee.  I understand that some of the potential risks of receiving the Services via telemedicine include:  Delay or interruption in medical evaluation due to technological equipment failure or disruption; Information transmitted may not be sufficient (e.g. poor resolution of images) to allow for appropriate medical decision making by the Practitioner;  and/or  In rare instances, security protocols could fail, causing a breach of personal health information.  Furthermore, I acknowledge that it is my responsibility to provide information about my medical history, conditions and care that is complete and accurate to the best of my ability. I acknowledge that Practitioner's advice, recommendations, and/or decision may be based on factors not within their control, such as incomplete or inaccurate data provided by me or distortions of diagnostic images or specimens that may result from electronic transmissions. I understand that the practice of medicine is not an exact science and that Practitioner makes no warranties or guarantees regarding treatment outcomes. I acknowledge that a copy of this consent can be made available to me via my patient portal ([facility]), or I can request a printed copy by calling the office of [facility].    I understand that my insurance will be billed for this visit.   I have read or had this consent read to me. I understand the contents of this consent, which adequately explains the benefits and risks of the Services being provided via telemedicine.  I have been provided ample opportunity to ask questions regarding this consent and the Services and have had my questions answered to my satisfaction. I give my informed consent for the services to be provided through the use of telemedicine in my medical care

## 2021-12-06 ENCOUNTER — Ambulatory Visit: Payer: Medicare HMO | Admitting: Cardiology

## 2021-12-06 ENCOUNTER — Other Ambulatory Visit: Payer: Self-pay

## 2021-12-06 DIAGNOSIS — I10 Essential (primary) hypertension: Secondary | ICD-10-CM

## 2021-12-06 DIAGNOSIS — R002 Palpitations: Secondary | ICD-10-CM

## 2021-12-06 DIAGNOSIS — Z8249 Family history of ischemic heart disease and other diseases of the circulatory system: Secondary | ICD-10-CM

## 2021-12-07 ENCOUNTER — Encounter: Payer: Medicare HMO | Admitting: Cardiology

## 2021-12-07 ENCOUNTER — Other Ambulatory Visit: Payer: Self-pay

## 2021-12-07 NOTE — Progress Notes (Signed)
This encounter was created in error - please disregard.

## 2021-12-23 DIAGNOSIS — M545 Low back pain, unspecified: Secondary | ICD-10-CM | POA: Diagnosis not present

## 2021-12-23 DIAGNOSIS — Z79899 Other long term (current) drug therapy: Secondary | ICD-10-CM | POA: Diagnosis not present

## 2021-12-23 DIAGNOSIS — E559 Vitamin D deficiency, unspecified: Secondary | ICD-10-CM | POA: Diagnosis not present

## 2021-12-23 DIAGNOSIS — M129 Arthropathy, unspecified: Secondary | ICD-10-CM | POA: Diagnosis not present

## 2021-12-26 NOTE — Progress Notes (Signed)
Cardiology Office Note:    Date:  12/27/2021   ID:  Sabrina Snyder, DOB [birth year], MRN [id number]  PCP:  Lujean Amel, [place] Providers Cardiologist:  Werner Lean, MD     Referring MD: Maurice Small, MD   CC: atypical chest pain Consulted for the evaluation of CP at the behest of Koirala, Dibas, MD  History of Present Illness:    Sabrina Snyder is a 61 y.o. female with a hx of Chronic Fatigue Syndrome (CFS) on diability, Fibromyalgia, HTN and HLD, and hypothyroidism seen for Atypical chest pain  with negative stress test in 2017, and 2019, with family history of CAD and aortic atherosclerosis.  Patient notes that she is doing poorly.   Notes that a year a go chest had CP at OSH with EKG changes.   Has had a return of her chest pain at rest.  Not related to activity. Had chest pain that is a nerve pain in the left arm, chest pressure with stress. This is new from 2019. Does have fibromyalgia and is struggling with this pain. Does note DOE.   Ambulatory blood pressure as high as 160 at home.   Past Medical History:  Diagnosis Date   CFS (chronic fatigue syndrome)    Fibromyalgia    Hashimoto's disease    HTN (hypertension)    Hyperlipidemia    Hypothyroidism (acquired)    IDA (iron deficiency anemia)     Past Surgical History:  Procedure Laterality Date   LAPAROSCOPIC TOTAL HYSTERECTOMY     thyroid ablation      Current Medications: Current Meds  Medication Sig   albuterol (VENTOLIN HFA) 108 (90 Base) MCG/ACT inhaler Inhale 1 puff into the lungs every 6 (six) hours as needed for wheezing or shortness of breath.   ALPRAZolam (XANAX) 0.25 MG tablet Take 0.25 mg by mouth daily as needed for anxiety.   ALPRAZolam (XANAX) 0.5 MG tablet Take 0.5 mg by mouth 2 (two) times daily as needed.   amLODipine (NORVASC) 10 MG tablet Take 1 tablet (10 mg total) by mouth daily.   Baclofen 5 MG TABS Take 1 tablet by mouth 3 (three) times daily as needed.    buPROPion (WELLBUTRIN SR) 150 MG 12 hr tablet Take 1 tablet (150 mg total) by mouth 2 (two) times daily.   Cholecalciferol 50 MCG (2000 UT) CAPS daily.   cyanocobalamin (,VITAMIN B-12,) 1000 MCG/ML injection as directed.   DULoxetine (CYMBALTA) 60 MG capsule TAKE 1 CAPSULE ONCE A DAY BY MOUTH DAILY   EPINEPHrine 0.3 mg/0.3 mL IJ SOAJ injection Inject 0.3 mg into the muscle as needed for anaphylaxis.   estrogens, conjugated, (PREMARIN) 0.625 MG tablet Take 1 tablet (0.625 mg total) by mouth daily. Take daily for 21 days then do not take for 7 days.   gabapentin (NEURONTIN) 300 MG capsule Take 300 mg by mouth in the morning and at bedtime.   hydrochlorothiazide (HYDRODIURIL) 25 MG tablet TAKE 1 TABLET BY MOUTH EVERY DAY   levothyroxine (SYNTHROID) 175 MCG tablet Take 175 mcg by mouth daily.   losartan (COZAAR) 100 MG tablet Take 100 mg by mouth daily.   metoprolol tartrate (LOPRESSOR) 100 MG tablet Take 2 hours prior to Cardiac CT   Omega-3 Fatty Acids (SALMON OIL-1000 PO) Take 1 tablet by mouth daily.   [DISCONTINUED] amLODipine (NORVASC) 5 MG tablet Take 5 mg by mouth daily.   Current Facility-Administered Medications for the 12/27/21 encounter (Office Visit) with Werner Lean,  MD  Medication   triamcinolone acetonide (KENALOG-40) injection 20 mg     Allergies:   Codeine, Doxycycline, Sulfa antibiotics, Ace inhibitors, Hydrochlorothiazide, and Other   Social History   Socioeconomic History   Marital status: Legally Separated    Spouse name: Not on file   Number of children: Not on file   Years of education: Not on file   Highest education level: Not on file  Occupational History   Occupation: disabled  Tobacco Use   Smoking status: Never   Smokeless tobacco: Never  Substance and Sexual Activity   Alcohol use: No   Drug use: No   Sexual activity: Yes    Partners: Male    Birth control/protection: Surgical  Other Topics Concern   Not on file  Social History  Narrative   Not on file   Social Determinants of Health   Financial Resource Strain: Not on file  Food Insecurity: Not on file  Transportation Needs: Not on file  Physical Activity: Not on file  Stress: Not on file  Social Connections: Not on file     Family History: The patient's family history includes Atrial fibrillation in her mother; Diabetes in her father; Heart disease in her father; Hyperlipidemia in her father; Hypertension in her brother, father, mother, sister, and sister; Pancreatic cancer in her father; Schizophrenia in her sister. Grandfather had MI at 65 Father had MI in 58s and had CABG Cousin died at 16 with prior PCI and died in MVC.  ROS:   Please see the history of present illness.     All other systems reviewed and are negative.  EKGs/Labs/Other Studies Reviewed:    The following studies were reviewed today:  EKG:  EKG is  ordered today.  The ekg ordered today demonstrates  12/27/21: SR rate 83 borderline anterior infarct- stable from 02/14/2018  ECG or NM Stress Testing : Date: 07/20/20 Results: Nuclear stress EF: 75%. Blood pressure demonstrated a hypertensive response to exercise. There was no ST segment deviation noted during stress. No T wave inversion was noted during stress. Defect 1: There is a small defect of mild severity present in the mid anterolateral and apical lateral location. This is worse at rest than stress, consistent with artifact. This is a low risk study. The left ventricular ejection fraction is hyperdynamic (>65%).   Recent Labs: No results found for requested labs within last 8760 hours.  Recent Lipid Panel No results found for: CHOL, TRIG, HDL, CHOLHDL, VLDL, LDLCALC, LDLDIRECT      Physical Exam:    VS:  BP (!) 135/97    Pulse 83    Ht 5\' 2"  (1.575 m)    Wt 74.4 kg    SpO2 100%    BMI 30.00 kg/m     Wt Readings from Last 3 Encounters:  12/27/21 74.4 kg  02/14/18 75.5 kg  01/11/17 80.8 kg    Gen: No distress   Neck:  No JVD  Ears: R ear Frank Sign Cardiac: No Rubs or Gallops, no Murmur, RRR +2 radial pulses Respiratory: Clear to auscultation bilaterally, normal effort, normal  respiratory rate GI: Soft, nontender, non-distended  MS: Non-pitting R edema;  moves all extremities Integument: Skin feels warm Neuro:  At time of evaluation, alert and oriented to person/place/time/situation  Psych: Normal affect, patient feels well   ASSESSMENT:    1. Precordial pain   2. DOE (dyspnea on exertion)   3. Aortic atherosclerosis (HCC)    PLAN:    CP  and DOE HTN CFS and Fibromyalgia Aortic Atherosclerosis HLD Hypothyroidism OSA FH of CAD - continue AMB BP and increased norvasc to 10 mg - continue losartan 100 and CHTZ 25 - will get CCTA (100 metoprolol); presently LDL goal < 70 - will get echo  - patient takes Omega 3 fatty acids, reasonable in the setting of HTN - if WNL, will see in 3-4 months for prevention planning       Medication Adjustments/Labs and Tests Ordered: Current medicines are reviewed at length with the patient today.  Concerns regarding medicines are outlined above.  Orders Placed This Encounter  Procedures   CT CORONARY MORPH W/CTA COR W/SCORE W/CA W/CM &/OR WO/CM   Basic metabolic panel   EKG 94-RDEY   ECHOCARDIOGRAM COMPLETE   Meds ordered this encounter  Medications   metoprolol tartrate (LOPRESSOR) 100 MG tablet    Sig: Take 2 hours prior to Cardiac CT    Dispense:  1 tablet    Refill:  0   amLODipine (NORVASC) 10 MG tablet    Sig: Take 1 tablet (10 mg total) by mouth daily.    Dispense:  180 tablet    Refill:  3    Patient Instructions  Medication Instructions:  Your physician has recommended you make the following change in your medication:  INCREASE: amlodipine (Norvasc) to 10 mg by mouth daily  *If you need a refill on your cardiac medications before your next appointment, please call your pharmacy*   Lab Work: TODAY: BMET If you have labs (blood  work) drawn today and your tests are completely normal, you will receive your results only by: [place] (if you have MyChart) OR A paper copy in the mail If you have any lab test that is abnormal or we need to change your treatment, we will call you to review the results.   Testing/Procedures: Your physician has requested that you have an echocardiogram. Echocardiography is a painless test that uses sound waves to create images of your heart. It provides your doctor with information about the size and shape of your heart and how well your hearts chambers and valves are working. This procedure takes approximately one hour. There are no restrictions for this procedure.   Your physician has requested that you have cardiac CT. Cardiac computed tomography (CT) is a painless test that uses an x-ray machine to take clear, detailed pictures of your heart. For further information please visit [email or web address].[email or web address]. Please follow instruction sheet as given.     Follow-Up: At [facility], you and your health needs are our priority.  As part of our continuing mission to provide you with exceptional heart care, we have created designated Provider Care Teams.  These Care Teams include your primary Cardiologist (physician) and Advanced Practice Providers (APPs -  Physician Assistants and Nurse Practitioners) who all work together to provide you with the care you need, when you need it.  We recommend signing up for the patient portal called "MyChart".  Sign up information is provided on this After Visit Summary.  MyChart is used to connect with patients for Virtual Visits (Telemedicine).  Patients are able to view lab/test results, encounter notes, upcoming appointments, etc.  Non-urgent messages can be sent to your provider as well.   To learn more about what you can do with MyChart, go to [email or web address].    Your next appointment:   3 - 4 month(s)  The format for your next appointment:    In Person  Provider:  Werner Lean, MD     Other Instructions    Your cardiac CT will be scheduled at the below location:   [facility] [address] [place], [place] [address] [phone]  At [facility], please arrive at the [facility] main entrance (entrance A) of [facility] 30 minutes prior to test start time. You can use the FREE valet parking offered at the main entrance (encouraged to control the heart rate for the test) Proceed to the [facility] Radiology Department (first floor) to check-in and test prep.   Please follow these instructions carefully (unless otherwise directed):   On the Night Before the Test: Be sure to Drink plenty of water. Do not consume any caffeinated/decaffeinated beverages or chocolate 12 hours prior to your test. Do not take any antihistamines 12 hours prior to your test.   On the Day of the Test: Drink plenty of water until 1 hour prior to the test. Do not eat any food 4 hours prior to the test. You may take your regular medications prior to the test.  Take metoprolol (Lopressor) 100 mg by mouth two hours prior to test. HOLD Hydrochlorothiazide morning of the test. FEMALES- please wear underwire-free bra if available, avoid dresses & tight clothing       After the Test: Drink plenty of water. After receiving IV contrast, you may experience a mild flushed feeling. This is normal. On occasion, you may experience a mild rash up to 24 hours after the test. This is not dangerous. If this occurs, you can take Benadryl 25 mg and increase your fluid intake. If you experience trouble breathing, this can be serious. If it is severe call 911 IMMEDIATELY. If it is mild, please call our office. If you take any of these medications: Glipizide/Metformin, Avandament, Glucavance, please do not take 48 hours after completing test unless otherwise instructed.  We will call to schedule your test 2-4 weeks out  understanding that some insurance companies will need an authorization prior to the service being performed.   For non-scheduling related questions, please contact the cardiac imaging nurse navigator should you have any questions/concerns: Marchia Bond, Cardiac Imaging Nurse Navigator Gordy Clement, Cardiac Imaging Nurse Navigator [facility] [facility] Direct Office Dial: [phone]   For scheduling needs, including cancellations and rescheduling, please call [place], [phone].     Signed, Werner Lean, MD  12/27/2021 10:17 AM    [place]

## 2021-12-27 ENCOUNTER — Other Ambulatory Visit: Payer: Self-pay

## 2021-12-27 ENCOUNTER — Ambulatory Visit (INDEPENDENT_AMBULATORY_CARE_PROVIDER_SITE_OTHER): Payer: Medicare HMO | Admitting: Internal Medicine

## 2021-12-27 ENCOUNTER — Encounter: Payer: Self-pay | Admitting: Internal Medicine

## 2021-12-27 VITALS — BP 135/97 | HR 83 | Ht 62.0 in | Wt 164.0 lb

## 2021-12-27 DIAGNOSIS — I700 Atherosclerosis of aorta: Secondary | ICD-10-CM | POA: Diagnosis not present

## 2021-12-27 DIAGNOSIS — R072 Precordial pain: Secondary | ICD-10-CM

## 2021-12-27 DIAGNOSIS — R0609 Other forms of dyspnea: Secondary | ICD-10-CM | POA: Insufficient documentation

## 2021-12-27 LAB — BASIC METABOLIC PANEL
BUN/Creatinine Ratio: 13 (ref 9–23)
BUN: 10 mg/dL (ref 6–24)
CO2: 26 mmol/L (ref 20–29)
Calcium: 9.6 mg/dL (ref 8.7–10.2)
Chloride: 101 mmol/L (ref 96–106)
Creatinine, Ser: 0.76 mg/dL (ref 0.57–1.00)
Glucose: 95 mg/dL (ref 70–99)
Potassium: 3.9 mmol/L (ref 3.5–5.2)
Sodium: 140 mmol/L (ref 134–144)
eGFR: 90 mL/min/{1.73_m2} (ref >59)

## 2021-12-27 MED ORDER — METOPROLOL TARTRATE 100 MG PO TABS: ORAL_TABLET | ORAL | 0 refills | Status: DC

## 2021-12-27 MED ORDER — AMLODIPINE BESYLATE 10 MG PO TABS: 10.0000 mg | ORAL_TABLET | Freq: Every day | ORAL | 3 refills | Status: DC

## 2021-12-27 NOTE — Patient Instructions (Signed)
Medication Instructions:  Your physician has recommended you make the following change in your medication:  INCREASE: amlodipine (Norvasc) to 10 mg by mouth daily  *If you need a refill on your cardiac medications before your next appointment, please call your pharmacy*   Lab Work: TODAY: BMET If you have labs (blood work) drawn today and your tests are completely normal, you will receive your results only by: [place] (if you have MyChart) OR A paper copy in the mail If you have any lab test that is abnormal or we need to change your treatment, we will call you to review the results.   Testing/Procedures: Your physician has requested that you have an echocardiogram. Echocardiography is a painless test that uses sound waves to create images of your heart. It provides your doctor with information about the size and shape of your heart and how well your hearts chambers and valves are working. This procedure takes approximately one hour. There are no restrictions for this procedure.   Your physician has requested that you have cardiac CT. Cardiac computed tomography (CT) is a painless test that uses an x-ray machine to take clear, detailed pictures of your heart. For further information please visit [email or web address].[email or web address]. Please follow instruction sheet as given.     Follow-Up: At [facility], you and your health needs are our priority.  As part of our continuing mission to provide you with exceptional heart care, we have created designated Provider Care Teams.  These Care Teams include your primary Cardiologist (physician) and Advanced Practice Providers (APPs -  Physician Assistants and Nurse Practitioners) who all work together to provide you with the care you need, when you need it.  We recommend signing up for the patient portal called "MyChart".  Sign up information is provided on this After Visit Summary.  MyChart is used to connect with patients for Virtual Visits  (Telemedicine).  Patients are able to view lab/test results, encounter notes, upcoming appointments, etc.  Non-urgent messages can be sent to your provider as well.   To learn more about what you can do with MyChart, go to [email or web address].    Your next appointment:   3 - 4 month(s)  The format for your next appointment:   In Person  Provider:   Werner Lean, MD     Other Instructions    Your cardiac CT will be scheduled at the below location:   [facility] [address] [place], [place] [address] [phone]  At [facility], please arrive at the [facility] main entrance (entrance A) of [facility] 30 minutes prior to test start time. You can use the FREE valet parking offered at the main entrance (encouraged to control the heart rate for the test) Proceed to the [facility] Radiology Department (first floor) to check-in and test prep.   Please follow these instructions carefully (unless otherwise directed):   On the Night Before the Test: Be sure to Drink plenty of water. Do not consume any caffeinated/decaffeinated beverages or chocolate 12 hours prior to your test. Do not take any antihistamines 12 hours prior to your test.   On the Day of the Test: Drink plenty of water until 1 hour prior to the test. Do not eat any food 4 hours prior to the test. You may take your regular medications prior to the test.  Take metoprolol (Lopressor) 100 mg by mouth two hours prior to test. HOLD Hydrochlorothiazide morning of the test. FEMALES- please wear  underwire-free bra if available, avoid dresses & tight clothing       After the Test: Drink plenty of water. After receiving IV contrast, you may experience a mild flushed feeling. This is normal. On occasion, you may experience a mild rash up to 24 hours after the test. This is not dangerous. If this occurs, you can take Benadryl 25 mg and increase your fluid intake. If you  experience trouble breathing, this can be serious. If it is severe call 911 IMMEDIATELY. If it is mild, please call our office. If you take any of these medications: Glipizide/Metformin, Avandament, Glucavance, please do not take 48 hours after completing test unless otherwise instructed.  We will call to schedule your test 2-4 weeks out understanding that some insurance companies will need an authorization prior to the service being performed.   For non-scheduling related questions, please contact the cardiac imaging nurse navigator should you have any questions/concerns: Marchia Bond, Cardiac Imaging Nurse Navigator Gordy Clement, Cardiac Imaging Nurse Navigator [place] [facility] Direct Office Dial: [phone]   For scheduling needs, including cancellations and rescheduling, please call [place], [phone].

## 2022-01-03 ENCOUNTER — Other Ambulatory Visit: Payer: Self-pay

## 2022-01-03 ENCOUNTER — Ambulatory Visit (HOSPITAL_COMMUNITY): Payer: Medicare HMO | Attending: Cardiovascular Disease

## 2022-01-03 DIAGNOSIS — R0609 Other forms of dyspnea: Secondary | ICD-10-CM | POA: Insufficient documentation

## 2022-01-03 LAB — ECHOCARDIOGRAM COMPLETE
Area-P 1/2: 3.93 cm2
S' Lateral: 2.6 cm

## 2022-01-09 DIAGNOSIS — E559 Vitamin D deficiency, unspecified: Secondary | ICD-10-CM | POA: Diagnosis not present

## 2022-01-09 DIAGNOSIS — M129 Arthropathy, unspecified: Secondary | ICD-10-CM | POA: Diagnosis not present

## 2022-01-09 DIAGNOSIS — Z79899 Other long term (current) drug therapy: Secondary | ICD-10-CM | POA: Diagnosis not present

## 2022-01-09 DIAGNOSIS — M545 Low back pain, unspecified: Secondary | ICD-10-CM | POA: Diagnosis not present

## 2022-01-09 DIAGNOSIS — G894 Chronic pain syndrome: Secondary | ICD-10-CM | POA: Diagnosis not present

## 2022-01-09 DIAGNOSIS — G8929 Other chronic pain: Secondary | ICD-10-CM | POA: Diagnosis not present

## 2022-01-13 ENCOUNTER — Telehealth (HOSPITAL_COMMUNITY): Payer: Self-pay | Admitting: Emergency Medicine

## 2022-01-13 NOTE — Telephone Encounter (Signed)
Attempted to call patient regarding upcoming cardiac CT appointment. °Left message on voicemail with name and callback number °Aleksander Edmiston RN Navigator Cardiac Imaging °[facility] °[phone] Office °[phone] Cell ° °

## 2022-01-16 ENCOUNTER — Ambulatory Visit (HOSPITAL_COMMUNITY): Admission: RE | Admit: 2022-01-16 | Payer: Medicare HMO | Source: Ambulatory Visit

## 2022-01-18 DIAGNOSIS — M545 Low back pain, unspecified: Secondary | ICD-10-CM | POA: Diagnosis not present

## 2022-01-18 DIAGNOSIS — R531 Weakness: Secondary | ICD-10-CM | POA: Diagnosis not present

## 2022-01-18 DIAGNOSIS — G894 Chronic pain syndrome: Secondary | ICD-10-CM | POA: Diagnosis not present

## 2022-01-18 DIAGNOSIS — M2560 Stiffness of unspecified joint, not elsewhere classified: Secondary | ICD-10-CM | POA: Diagnosis not present

## 2022-01-26 DIAGNOSIS — J45909 Unspecified asthma, uncomplicated: Secondary | ICD-10-CM | POA: Diagnosis not present

## 2022-01-26 DIAGNOSIS — J209 Acute bronchitis, unspecified: Secondary | ICD-10-CM | POA: Diagnosis not present

## 2022-01-26 DIAGNOSIS — Z20822 Contact with and (suspected) exposure to covid-19: Secondary | ICD-10-CM | POA: Diagnosis not present

## 2022-01-26 DIAGNOSIS — H6691 Otitis media, unspecified, right ear: Secondary | ICD-10-CM | POA: Diagnosis not present

## 2022-01-30 ENCOUNTER — Telehealth (HOSPITAL_COMMUNITY): Payer: Self-pay | Admitting: *Deleted

## 2022-01-30 NOTE — Telephone Encounter (Signed)
Reaching out to patient to offer assistance regarding upcoming cardiac imaging study; pt verbalizes understanding of appt date/time, parking situation and where to check in, pre-test NPO status and medications ordered, and verified current allergies; name and call back number provided for further questions should they arise ? ?Gordy Clement RN Navigator Cardiac Imaging ?[facility] ?[phone] office ?[phone] cell ? ?Patient to take '100mg'$  metoprolol tartrate two hours prior to her cardiac CT scan.  She is aware to arrive at 1:30pm for her 2pm scan. ?

## 2022-01-31 ENCOUNTER — Ambulatory Visit (HOSPITAL_COMMUNITY)
Admission: RE | Admit: 2022-01-31 | Discharge: 2022-01-31 | Disposition: A | Discharge status: Home / Self Care | Payer: Medicare HMO | Source: Ambulatory Visit | Attending: Internal Medicine | Admitting: Internal Medicine

## 2022-01-31 ENCOUNTER — Other Ambulatory Visit: Payer: Self-pay

## 2022-01-31 DIAGNOSIS — R072 Precordial pain: Secondary | ICD-10-CM | POA: Insufficient documentation

## 2022-01-31 MED ORDER — NITROGLYCERIN 0.4 MG SL SUBL
SUBLINGUAL_TABLET | SUBLINGUAL | Status: AC
  Filled 2022-01-31: qty 2

## 2022-01-31 MED ORDER — IOHEXOL 350 MG/ML SOLN
95.0000 mL | Freq: Once | INTRAVENOUS | Status: AC | PRN
  Administered 2022-01-31: 95 mL via INTRAVENOUS

## 2022-01-31 MED ORDER — NITROGLYCERIN 0.4 MG SL SUBL
0.8000 mg | SUBLINGUAL_TABLET | Freq: Once | SUBLINGUAL | Status: AC
  Administered 2022-01-31: 0.8 mg via SUBLINGUAL

## 2022-02-06 DIAGNOSIS — M545 Low back pain, unspecified: Secondary | ICD-10-CM | POA: Diagnosis not present

## 2022-02-06 DIAGNOSIS — Z6829 Body mass index (BMI) 29.0-29.9, adult: Secondary | ICD-10-CM | POA: Diagnosis not present

## 2022-02-06 DIAGNOSIS — G894 Chronic pain syndrome: Secondary | ICD-10-CM | POA: Diagnosis not present

## 2022-02-06 DIAGNOSIS — R030 Elevated blood-pressure reading, without diagnosis of hypertension: Secondary | ICD-10-CM | POA: Diagnosis not present

## 2022-02-06 DIAGNOSIS — Z79899 Other long term (current) drug therapy: Secondary | ICD-10-CM | POA: Diagnosis not present

## 2022-02-06 DIAGNOSIS — G8929 Other chronic pain: Secondary | ICD-10-CM | POA: Diagnosis not present

## 2022-02-09 DIAGNOSIS — M5416 Radiculopathy, lumbar region: Secondary | ICD-10-CM | POA: Diagnosis not present

## 2022-02-09 DIAGNOSIS — Z79899 Other long term (current) drug therapy: Secondary | ICD-10-CM | POA: Diagnosis not present

## 2022-03-07 DIAGNOSIS — R300 Dysuria: Secondary | ICD-10-CM | POA: Diagnosis not present

## 2022-03-13 DIAGNOSIS — M545 Low back pain, unspecified: Secondary | ICD-10-CM | POA: Diagnosis not present

## 2022-03-13 DIAGNOSIS — R030 Elevated blood-pressure reading, without diagnosis of hypertension: Secondary | ICD-10-CM | POA: Diagnosis not present

## 2022-03-13 DIAGNOSIS — G8929 Other chronic pain: Secondary | ICD-10-CM | POA: Diagnosis not present

## 2022-03-13 DIAGNOSIS — G894 Chronic pain syndrome: Secondary | ICD-10-CM | POA: Diagnosis not present

## 2022-03-13 DIAGNOSIS — Z79899 Other long term (current) drug therapy: Secondary | ICD-10-CM | POA: Diagnosis not present

## 2022-03-13 DIAGNOSIS — Z6828 Body mass index (BMI) 28.0-28.9, adult: Secondary | ICD-10-CM | POA: Diagnosis not present

## 2022-03-14 DIAGNOSIS — R300 Dysuria: Secondary | ICD-10-CM | POA: Diagnosis not present

## 2022-03-14 DIAGNOSIS — N3000 Acute cystitis without hematuria: Secondary | ICD-10-CM | POA: Diagnosis not present

## 2022-03-28 DIAGNOSIS — R300 Dysuria: Secondary | ICD-10-CM | POA: Diagnosis not present

## 2022-03-28 DIAGNOSIS — Z889 Allergy status to unspecified drugs, medicaments and biological substances status: Secondary | ICD-10-CM | POA: Diagnosis not present

## 2022-03-30 ENCOUNTER — Ambulatory Visit: Payer: Medicare HMO | Admitting: Internal Medicine

## 2022-04-03 DIAGNOSIS — N200 Calculus of kidney: Secondary | ICD-10-CM | POA: Diagnosis not present

## 2022-04-03 DIAGNOSIS — R0600 Dyspnea, unspecified: Secondary | ICD-10-CM | POA: Diagnosis not present

## 2022-04-03 DIAGNOSIS — I10 Essential (primary) hypertension: Secondary | ICD-10-CM | POA: Diagnosis not present

## 2022-04-03 DIAGNOSIS — R531 Weakness: Secondary | ICD-10-CM | POA: Diagnosis not present

## 2022-04-03 DIAGNOSIS — J9601 Acute respiratory failure with hypoxia: Secondary | ICD-10-CM | POA: Diagnosis not present

## 2022-04-03 DIAGNOSIS — R0902 Hypoxemia: Secondary | ICD-10-CM | POA: Diagnosis not present

## 2022-04-03 DIAGNOSIS — R0689 Other abnormalities of breathing: Secondary | ICD-10-CM | POA: Diagnosis not present

## 2022-04-03 DIAGNOSIS — R000 Tachycardia, unspecified: Secondary | ICD-10-CM | POA: Diagnosis not present

## 2022-04-03 DIAGNOSIS — R6520 Severe sepsis without septic shock: Secondary | ICD-10-CM | POA: Diagnosis not present

## 2022-04-03 DIAGNOSIS — R509 Fever, unspecified: Secondary | ICD-10-CM | POA: Diagnosis not present

## 2022-04-03 DIAGNOSIS — A419 Sepsis, unspecified organism: Secondary | ICD-10-CM | POA: Diagnosis not present

## 2022-04-03 DIAGNOSIS — N12 Tubulo-interstitial nephritis, not specified as acute or chronic: Secondary | ICD-10-CM | POA: Diagnosis not present

## 2022-04-04 DIAGNOSIS — N12 Tubulo-interstitial nephritis, not specified as acute or chronic: Secondary | ICD-10-CM | POA: Diagnosis not present

## 2022-04-04 DIAGNOSIS — I10 Essential (primary) hypertension: Secondary | ICD-10-CM | POA: Diagnosis not present

## 2022-04-04 DIAGNOSIS — G629 Polyneuropathy, unspecified: Secondary | ICD-10-CM | POA: Diagnosis not present

## 2022-04-04 DIAGNOSIS — E876 Hypokalemia: Secondary | ICD-10-CM | POA: Diagnosis not present

## 2022-04-04 DIAGNOSIS — E039 Hypothyroidism, unspecified: Secondary | ICD-10-CM | POA: Diagnosis not present

## 2022-04-04 DIAGNOSIS — Z2821 Immunization not carried out because of patient refusal: Secondary | ICD-10-CM | POA: Diagnosis not present

## 2022-04-04 DIAGNOSIS — Z87442 Personal history of urinary calculi: Secondary | ICD-10-CM | POA: Diagnosis not present

## 2022-04-04 DIAGNOSIS — N10 Acute tubulo-interstitial nephritis: Secondary | ICD-10-CM | POA: Diagnosis not present

## 2022-04-04 DIAGNOSIS — B9620 Unspecified Escherichia coli [E. coli] as the cause of diseases classified elsewhere: Secondary | ICD-10-CM | POA: Diagnosis not present

## 2022-04-04 DIAGNOSIS — N200 Calculus of kidney: Secondary | ICD-10-CM | POA: Diagnosis not present

## 2022-04-04 DIAGNOSIS — E669 Obesity, unspecified: Secondary | ICD-10-CM | POA: Diagnosis not present

## 2022-04-04 DIAGNOSIS — A419 Sepsis, unspecified organism: Secondary | ICD-10-CM | POA: Diagnosis not present

## 2022-04-04 DIAGNOSIS — F419 Anxiety disorder, unspecified: Secondary | ICD-10-CM | POA: Diagnosis not present

## 2022-04-04 DIAGNOSIS — M797 Fibromyalgia: Secondary | ICD-10-CM | POA: Diagnosis not present

## 2022-04-04 DIAGNOSIS — F32A Depression, unspecified: Secondary | ICD-10-CM | POA: Diagnosis not present

## 2022-04-04 DIAGNOSIS — R6520 Severe sepsis without septic shock: Secondary | ICD-10-CM | POA: Diagnosis not present

## 2022-04-04 DIAGNOSIS — J690 Pneumonitis due to inhalation of food and vomit: Secondary | ICD-10-CM | POA: Diagnosis not present

## 2022-04-04 DIAGNOSIS — R0600 Dyspnea, unspecified: Secondary | ICD-10-CM | POA: Diagnosis not present

## 2022-04-04 DIAGNOSIS — R300 Dysuria: Secondary | ICD-10-CM | POA: Diagnosis not present

## 2022-04-04 DIAGNOSIS — J9601 Acute respiratory failure with hypoxia: Secondary | ICD-10-CM | POA: Diagnosis not present

## 2022-04-04 DIAGNOSIS — Z882 Allergy status to sulfonamides status: Secondary | ICD-10-CM | POA: Diagnosis not present

## 2022-04-04 DIAGNOSIS — Z792 Long term (current) use of antibiotics: Secondary | ICD-10-CM | POA: Diagnosis not present

## 2022-04-04 DIAGNOSIS — J45909 Unspecified asthma, uncomplicated: Secondary | ICD-10-CM | POA: Diagnosis not present

## 2022-04-04 DIAGNOSIS — Z885 Allergy status to narcotic agent status: Secondary | ICD-10-CM | POA: Diagnosis not present

## 2022-04-04 DIAGNOSIS — Z6830 Body mass index (BMI) 30.0-30.9, adult: Secondary | ICD-10-CM | POA: Diagnosis not present

## 2022-04-04 DIAGNOSIS — Z79899 Other long term (current) drug therapy: Secondary | ICD-10-CM | POA: Diagnosis not present

## 2022-04-04 DIAGNOSIS — R531 Weakness: Secondary | ICD-10-CM | POA: Diagnosis not present

## 2022-04-11 DIAGNOSIS — J690 Pneumonitis due to inhalation of food and vomit: Secondary | ICD-10-CM | POA: Diagnosis not present

## 2022-04-11 DIAGNOSIS — J9601 Acute respiratory failure with hypoxia: Secondary | ICD-10-CM | POA: Diagnosis not present

## 2022-04-11 DIAGNOSIS — A419 Sepsis, unspecified organism: Secondary | ICD-10-CM | POA: Diagnosis not present

## 2022-04-14 DIAGNOSIS — N200 Calculus of kidney: Secondary | ICD-10-CM | POA: Diagnosis not present

## 2022-04-14 DIAGNOSIS — A419 Sepsis, unspecified organism: Secondary | ICD-10-CM | POA: Diagnosis not present

## 2022-04-14 DIAGNOSIS — E039 Hypothyroidism, unspecified: Secondary | ICD-10-CM | POA: Diagnosis not present

## 2022-04-14 DIAGNOSIS — N12 Tubulo-interstitial nephritis, not specified as acute or chronic: Secondary | ICD-10-CM | POA: Diagnosis not present

## 2022-04-16 NOTE — Progress Notes (Unsigned)
Cardiology Office Note:    Date:  04/17/2022   ID:  Sabrina Snyder, DOB 12-Jan-1961, MRN [id number]  PCP:  Sabrina Snyder, [place] [facility] Cardiologist:  Sabrina Lean, MD     Referring MD: Sabrina Amel, MD   CC: Cardiac CT f/u  History of Present Illness:    Sabrina Snyder is a 61 y.o. female with a hx of Chronic Fatigue Syndrome (CFS) on diability, Fibromyalgia, HTN and HLD, and hypothyroidism seen for Atypical chest pain  with negative stress test in 2017, and 2019, with family history of CAD and aortic atherosclerosis.  Had minimal non obstructive CAD 01/31/22.  Patient notes that she is doing well.   Has had a UTI and started a variety of medications Had sepsis and in May from a UTI in [place].  No chest pain or pressure.  No SOB/DOE and no PND/Orthopnea.  Has been walking some before she got sick. No weight gain or leg swelling (has had some intentional weight loss)  No palpitations or syncope .  Getting back injections and and on new pain regimen.   Past Medical History:  Diagnosis Date   CFS (chronic fatigue syndrome)    Fibromyalgia    Hashimoto's disease    HTN (hypertension)    Hyperlipidemia    Hypothyroidism (acquired)    IDA (iron deficiency anemia)     Past Surgical History:  Procedure Laterality Date   LAPAROSCOPIC TOTAL HYSTERECTOMY     thyroid ablation      Current Medications: Current Meds  Medication Sig   albuterol (VENTOLIN HFA) 108 (90 Base) MCG/ACT inhaler Inhale 1 puff into the lungs every 6 (six) hours as needed for wheezing or shortness of breath.   ALPRAZolam (XANAX) 0.5 MG tablet Take 0.5 mg by mouth 2 (two) times daily as needed.   amLODipine (NORVASC) 10 MG tablet Take 1 tablet (10 mg total) by mouth daily.   aspirin EC 81 MG tablet Take 1 tablet (81 mg total) by mouth daily. Swallow whole.   Baclofen 5 MG TABS Take 1 tablet by mouth 3 (three) times daily as needed.   buPROPion (WELLBUTRIN SR) 150 MG 12  hr tablet Take 150 mg by mouth daily.   cholecalciferol (VITAMIN D3) 25 MCG (1000 UNIT) tablet daily at 6 (six) AM.   ciprofloxacin (CIPRO) 500 MG tablet Take 500 mg by mouth 2 (two) times daily.   cyanocobalamin (,VITAMIN B-12,) 1000 MCG/ML injection as directed.   DULoxetine (CYMBALTA) 60 MG capsule TAKE 1 CAPSULE ONCE A DAY BY MOUTH DAILY   EPINEPHrine 0.3 mg/0.3 mL IJ SOAJ injection Inject 0.3 mg into the muscle as needed for anaphylaxis.   estrogens, conjugated, (PREMARIN) 0.625 MG tablet Take 1 tablet (0.625 mg total) by mouth daily. Take daily for 21 days then do not take for 7 days.   gabapentin (NEURONTIN) 300 MG capsule Take 300 mg by mouth in the morning and at bedtime.   hydrochlorothiazide (HYDRODIURIL) 25 MG tablet TAKE 1 TABLET BY MOUTH EVERY DAY   levothyroxine (SYNTHROID) 175 MCG tablet Take 175 mcg by mouth daily.   losartan (COZAAR) 100 MG tablet Take 100 mg by mouth daily.   Omega-3 Fatty Acids (SALMON OIL-1000 PO) Take 1 tablet by mouth daily.   Current Facility-Administered Medications for the 04/17/22 encounter (Office Visit) with Sabrina Lean, MD  Medication   triamcinolone acetonide (KENALOG-40) injection 20 mg     Allergies:   Codeine, Doxycycline, Sulfa antibiotics, Ace  inhibitors, Nitrofurantoin, and Other   Social History   Socioeconomic History   Marital status: Legally Separated    Spouse name: Not on file   Number of children: Not on file   Years of education: Not on file   Highest education level: Not on file  Occupational History   Occupation: disabled  Tobacco Use   Smoking status: Never   Smokeless tobacco: Never  Substance and Sexual Activity   Alcohol use: No   Drug use: No   Sexual activity: Yes    Partners: Male    Birth control/protection: Surgical  Other Topics Concern   Not on file  Social History Narrative   Not on file   Social Determinants of Health   Financial Resource Strain: Not on file  Food Insecurity: Not on  file  Transportation Needs: Not on file  Physical Activity: Not on file  Stress: Not on file  Social Connections: Not on file     Family History: The patient's family history includes Atrial fibrillation in her mother; Diabetes in her father; Heart disease in her father; Hyperlipidemia in her father; Hypertension in her brother, father, mother, sister, and sister; Pancreatic cancer in her father; Schizophrenia in her sister. Grandfather had MI at 56 Father had MI in 46s and had CABG Cousin died at 56 with prior PCI and died in MVC.  ROS:   Please see the history of present illness.     All other systems reviewed and are negative.  EKGs/Labs/Other Studies Reviewed:    The following studies were reviewed today:  EKG:   12/27/21: SR rate 83 borderline anterior infarct- stable from 02/14/2018  ECG or NM Stress Testing : Date: 07/20/20 Results: Nuclear stress EF: 75%. Blood pressure demonstrated a hypertensive response to exercise. There was no ST segment deviation noted during stress. No T wave inversion was noted during stress. Defect 1: There is a small defect of mild severity present in the mid anterolateral and apical lateral location. This is worse at rest than stress, consistent with artifact. This is a low risk study. The left ventricular ejection fraction is hyperdynamic (>65%).   Recent Labs: 12/27/2021: BUN 10; Creatinine, Ser 0.76; Potassium 3.9; Sodium 140  Recent Lipid Panel No results found for: CHOL, TRIG, HDL, CHOLHDL, VLDL, LDLCALC, LDLDIRECT      Physical Exam:    VS:  BP 120/72   Pulse 96   Ht 5' 2.5" (1.588 m)   Wt 164 lb (74.4 kg)   SpO2 96%   BMI 29.52 kg/m     Wt Readings from Last 3 Encounters:  04/17/22 164 lb (74.4 kg)  12/27/21 164 lb (74.4 kg)  02/14/18 166 lb 8 oz (75.5 kg)    Gen: No distress   Neck: No JVD  Cardiac: No Rubs or Gallops, no Murmur, RRR +2 radial pulses Respiratory: Clear to auscultation bilaterally, normal effort, normal   respiratory rate GI: Soft, nontender, non-distended  MS: No- edema;  moves all extremities Integument: Skin feels warm Neuro:  At time of evaluation, alert and oriented to person/place/time/situation  Psych: Normal affect, patient feels well   ASSESSMENT:    1. Coronary artery disease involving native coronary artery of native heart without angina pectoris   2. Aortic atherosclerosis ([place])   3. Primary hypertension     PLAN:    Minimal non obstructive CAD CFS and Fibromyalgia Hypothyroidism OSA and HTN FH of CAD Aortic Atherosclerosis &HLD - reviewed CT with patient - age < 93 and  CAC with elevated percentile, will do ASA 81 mg PO daily - she gets fasting labs 05/10/22 with Dr. Raliegh Ip and we will attempt to get copies for potential LDL goal < 70 - continue Norvasc 10 mg - continue losartan 100  - patient takes Omega 3 fatty acids, reasonable in the setting of HTN  - we have discussed exercise and dietary changes, and potential medication start based on labs (rosuvastatin 5 mg PO daily)  One year me or APP       Medication Adjustments/Labs and Tests Ordered: Current medicines are reviewed at length with the patient today.  Concerns regarding medicines are outlined above.  No orders of the defined types were placed in this encounter.  Meds ordered this encounter  Medications   aspirin EC 81 MG tablet    Sig: Take 1 tablet (81 mg total) by mouth daily. Swallow whole.    Dispense:  90 tablet    Refill:  3    Patient Instructions  Medication Instructions:  Your physician has recommended you make the following change in your medication:  START: Aspirin 81 mg by mouth daily *If you need a refill on your cardiac medications before your next appointment, please call your pharmacy*   Lab Work: NONE If you have labs (blood work) drawn today and your tests are completely normal, you will receive your results only by: [place] (if you have MyChart) OR A paper copy in  the mail If you have any lab test that is abnormal or we need to change your treatment, we will call you to review the results.   Testing/Procedures: NONE   Follow-Up: At [facility], you and your health needs are our priority.  As part of our continuing mission to provide you with exceptional heart care, we have created designated Provider Care Teams.  These Care Teams include your primary Cardiologist (physician) and Advanced Practice Providers (APPs -  Physician Assistants and Nurse Practitioners) who all work together to provide you with the care you need, when you need it.  We recommend signing up for the patient portal called "MyChart".  Sign up information is provided on this After Visit Summary.  MyChart is used to connect with patients for Virtual Visits (Telemedicine).  Patients are able to view lab/test results, encounter notes, upcoming appointments, etc.  Non-urgent messages can be sent to your provider as well.   To learn more about what you can do with MyChart, go to [email or web address].    Your next appointment:   1 year(s)  The format for your next appointment:   In Person  Provider:   Werner Lean, MD    Important Information About Sugar         Signed, Sabrina Lean, MD  04/17/2022 4:28 PM    [place]

## 2022-04-17 ENCOUNTER — Ambulatory Visit (INDEPENDENT_AMBULATORY_CARE_PROVIDER_SITE_OTHER): Payer: Medicare HMO | Admitting: Internal Medicine

## 2022-04-17 ENCOUNTER — Encounter: Payer: Self-pay | Admitting: Internal Medicine

## 2022-04-17 VITALS — BP 120/72 | HR 96 | Ht 62.5 in | Wt 164.0 lb

## 2022-04-17 DIAGNOSIS — I10 Essential (primary) hypertension: Secondary | ICD-10-CM | POA: Diagnosis not present

## 2022-04-17 DIAGNOSIS — I2510 Atherosclerotic heart disease of native coronary artery without angina pectoris: Secondary | ICD-10-CM | POA: Diagnosis not present

## 2022-04-17 DIAGNOSIS — M797 Fibromyalgia: Secondary | ICD-10-CM | POA: Diagnosis not present

## 2022-04-17 DIAGNOSIS — I700 Atherosclerosis of aorta: Secondary | ICD-10-CM | POA: Diagnosis not present

## 2022-04-17 MED ORDER — ASPIRIN 81 MG PO TBEC: 81.0000 mg | DELAYED_RELEASE_TABLET | Freq: Every day | ORAL | 3 refills | Status: DC

## 2022-04-17 NOTE — Patient Instructions (Signed)
Medication Instructions:  Your physician has recommended you make the following change in your medication:  START: Aspirin 81 mg by mouth daily *If you need a refill on your cardiac medications before your next appointment, please call your pharmacy*   Lab Work: NONE If you have labs (blood work) drawn today and your tests are completely normal, you will receive your results only by: [place] (if you have MyChart) OR A paper copy in the mail If you have any lab test that is abnormal or we need to change your treatment, we will call you to review the results.   Testing/Procedures: NONE   Follow-Up: At [facility], you and your health needs are our priority.  As part of our continuing mission to provide you with exceptional heart care, we have created designated Provider Care Teams.  These Care Teams include your primary Cardiologist (physician) and Advanced Practice Providers (APPs -  Physician Assistants and Nurse Practitioners) who all work together to provide you with the care you need, when you need it.  We recommend signing up for the patient portal called "MyChart".  Sign up information is provided on this After Visit Summary.  MyChart is used to connect with patients for Virtual Visits (Telemedicine).  Patients are able to view lab/test results, encounter notes, upcoming appointments, etc.  Non-urgent messages can be sent to your provider as well.   To learn more about what you can do with MyChart, go to [email or web address].    Your next appointment:   1 year(s)  The format for your next appointment:   In Person  Provider:   Werner Lean, MD    Important Information About Sugar

## 2022-04-18 DIAGNOSIS — M545 Low back pain, unspecified: Secondary | ICD-10-CM | POA: Diagnosis not present

## 2022-04-18 DIAGNOSIS — G894 Chronic pain syndrome: Secondary | ICD-10-CM | POA: Diagnosis not present

## 2022-04-18 DIAGNOSIS — R030 Elevated blood-pressure reading, without diagnosis of hypertension: Secondary | ICD-10-CM | POA: Diagnosis not present

## 2022-04-18 DIAGNOSIS — Z6829 Body mass index (BMI) 29.0-29.9, adult: Secondary | ICD-10-CM | POA: Diagnosis not present

## 2022-04-18 DIAGNOSIS — G8929 Other chronic pain: Secondary | ICD-10-CM | POA: Diagnosis not present

## 2022-04-27 DIAGNOSIS — R102 Pelvic and perineal pain: Secondary | ICD-10-CM | POA: Diagnosis not present

## 2022-04-27 DIAGNOSIS — Z113 Encounter for screening for infections with a predominantly sexual mode of transmission: Secondary | ICD-10-CM | POA: Diagnosis not present

## 2022-04-27 DIAGNOSIS — N760 Acute vaginitis: Secondary | ICD-10-CM | POA: Diagnosis not present

## 2022-04-27 DIAGNOSIS — Z1159 Encounter for screening for other viral diseases: Secondary | ICD-10-CM | POA: Diagnosis not present

## 2022-05-02 DIAGNOSIS — N390 Urinary tract infection, site not specified: Secondary | ICD-10-CM | POA: Diagnosis not present

## 2022-05-02 DIAGNOSIS — B9689 Other specified bacterial agents as the cause of diseases classified elsewhere: Secondary | ICD-10-CM | POA: Diagnosis not present

## 2022-05-02 DIAGNOSIS — Z79899 Other long term (current) drug therapy: Secondary | ICD-10-CM | POA: Diagnosis not present

## 2022-05-02 DIAGNOSIS — N952 Postmenopausal atrophic vaginitis: Secondary | ICD-10-CM | POA: Diagnosis not present

## 2022-05-02 DIAGNOSIS — N3946 Mixed incontinence: Secondary | ICD-10-CM | POA: Diagnosis not present

## 2022-05-02 DIAGNOSIS — N760 Acute vaginitis: Secondary | ICD-10-CM | POA: Diagnosis not present

## 2022-05-26 DIAGNOSIS — N390 Urinary tract infection, site not specified: Secondary | ICD-10-CM | POA: Diagnosis not present

## 2022-06-16 ENCOUNTER — Ambulatory Visit: Payer: Medicare HMO | Admitting: Internal Medicine

## 2022-06-21 DIAGNOSIS — E039 Hypothyroidism, unspecified: Secondary | ICD-10-CM | POA: Diagnosis not present

## 2022-06-21 DIAGNOSIS — I700 Atherosclerosis of aorta: Secondary | ICD-10-CM | POA: Diagnosis not present

## 2022-06-21 DIAGNOSIS — K5901 Slow transit constipation: Secondary | ICD-10-CM | POA: Diagnosis not present

## 2022-06-21 DIAGNOSIS — Z79899 Other long term (current) drug therapy: Secondary | ICD-10-CM | POA: Diagnosis not present

## 2022-06-21 DIAGNOSIS — E7800 Pure hypercholesterolemia, unspecified: Secondary | ICD-10-CM | POA: Diagnosis not present

## 2022-06-21 DIAGNOSIS — I10 Essential (primary) hypertension: Secondary | ICD-10-CM | POA: Diagnosis not present

## 2022-06-21 DIAGNOSIS — E538 Deficiency of other specified B group vitamins: Secondary | ICD-10-CM | POA: Diagnosis not present

## 2022-06-23 DIAGNOSIS — H6982 Other specified disorders of Eustachian tube, left ear: Secondary | ICD-10-CM | POA: Diagnosis not present

## 2022-06-23 DIAGNOSIS — H9202 Otalgia, left ear: Secondary | ICD-10-CM | POA: Diagnosis not present

## 2022-06-27 DIAGNOSIS — M5416 Radiculopathy, lumbar region: Secondary | ICD-10-CM | POA: Diagnosis not present

## 2022-07-12 ENCOUNTER — Telehealth: Payer: Self-pay

## 2022-07-12 MED ORDER — ROSUVASTATIN CALCIUM 5 MG PO TABS: 5.0000 mg | ORAL_TABLET | Freq: Every day | ORAL | 11 refills | Status: DC

## 2022-07-12 NOTE — Telephone Encounter (Signed)
Follow Up:     Patient is returning Sabrina Snyder call from today

## 2022-07-12 NOTE — Telephone Encounter (Signed)
Spoke with the patient and advised on recommendations per Dr. Gasper Sells. Patient verbalized understanding. Rx has been sent in. She will have labs repeated at her PCP and faxed to [place].

## 2022-07-12 NOTE — Telephone Encounter (Signed)
Left message for patient to call back  

## 2022-07-12 NOTE — Telephone Encounter (Signed)
-----   Message from Werner Lean, MD sent at 07/12/2022  7:49 AM EDT ----- Regarding: New medication start Labs received from PCP 06/21/22: LDL above goal with normal LFTs. Per our clinic discussion recommend rosuvastatin 5 mg and fasting lipids and an ALT in three months  Thanks, MAC

## 2022-07-24 ENCOUNTER — Telehealth: Payer: Self-pay

## 2022-07-24 NOTE — Telephone Encounter (Signed)
Patient No Showed for PV. Patient was rescheduled for 07/28/22. Procedure is scheduled for 07/31/22. I called the patient no answer. I left her a message telling her which foods to avoid 5 days prior to her procedure. Patient was instructed to stop certain foods beginning Wednesday 07/26/22.

## 2022-07-28 ENCOUNTER — Ambulatory Visit (AMBULATORY_SURGERY_CENTER): Payer: Medicare HMO

## 2022-07-28 VITALS — Ht 62.5 in | Wt 164.0 lb

## 2022-07-28 DIAGNOSIS — Z1211 Encounter for screening for malignant neoplasm of colon: Secondary | ICD-10-CM

## 2022-07-28 MED ORDER — PEG 3350-KCL-NA BICARB-NACL 420 G PO SOLR: 4000.0000 mL | Freq: Once | ORAL | 0 refills | Status: AC

## 2022-07-28 NOTE — Progress Notes (Signed)
No egg or soy allergy known to patient  No issues known to pt with past sedation with any surgeries or procedures Patient denies ever being told they had issues or difficulty with intubation  No FH of Malignant Hyperthermia Pt is not on diet pills Pt is not on  home 02  Pt is not on blood thinners  Pt reports mild issues with constipation, taking no laxatives No A fib or A flutter Have any cardiac testing pending--denied Pt instructed to use [email or web address] or GoodRx for a price reduction on prep

## 2022-07-31 ENCOUNTER — Encounter: Payer: Self-pay | Admitting: Gastroenterology

## 2022-07-31 ENCOUNTER — Ambulatory Visit (AMBULATORY_SURGERY_CENTER): Payer: Medicare HMO | Admitting: Gastroenterology

## 2022-07-31 VITALS — BP 95/58 | HR 81 | Temp 97.3°F | Resp 14 | Ht 62.5 in | Wt 164.0 lb

## 2022-07-31 DIAGNOSIS — E039 Hypothyroidism, unspecified: Secondary | ICD-10-CM | POA: Diagnosis not present

## 2022-07-31 DIAGNOSIS — Z1211 Encounter for screening for malignant neoplasm of colon: Secondary | ICD-10-CM | POA: Diagnosis not present

## 2022-07-31 DIAGNOSIS — D123 Benign neoplasm of transverse colon: Secondary | ICD-10-CM

## 2022-07-31 DIAGNOSIS — I10 Essential (primary) hypertension: Secondary | ICD-10-CM | POA: Diagnosis not present

## 2022-07-31 DIAGNOSIS — D120 Benign neoplasm of cecum: Secondary | ICD-10-CM

## 2022-07-31 DIAGNOSIS — M797 Fibromyalgia: Secondary | ICD-10-CM | POA: Diagnosis not present

## 2022-07-31 MED ORDER — SODIUM CHLORIDE 0.9 % IV SOLN: 500.0000 mL | Freq: Once | INTRAVENOUS | Status: DC

## 2022-07-31 NOTE — Op Note (Signed)
[facility] Patient Name: Sabrina Snyder Procedure Date: 07/31/2022 2:52 PM MRN: [id number] Endoscopist: [name] , MD Age: 61 Referring MD:  Date of Birth: 1961/03/08 Gender: Female Account #: [id number] Procedure:                Colonoscopy Indications:              Screening for colorectal malignant neoplasm Medicines:                Monitored Anesthesia Care Procedure:                Pre-Anesthesia Assessment:                           - Prior to the procedure, a History and Physical                            was performed, and patient medications and                            allergies were reviewed. The patient's tolerance of                            previous anesthesia was also reviewed. The risks                            and benefits of the procedure and the sedation                            options and risks were discussed with the patient.                            All questions were answered, and informed consent                            was obtained. Prior Anticoagulants: The patient has                            taken no previous anticoagulant or antiplatelet                            agents. ASA Grade Assessment: II - A patient with                            mild systemic disease. After reviewing the risks                            and benefits, the patient was deemed in                            satisfactory condition to undergo the procedure.                           After obtaining informed consent, the colonoscope  was passed under direct vision. Throughout the                            procedure, the patient's blood pressure, pulse, and                            oxygen saturations were monitored continuously. The                            Olympus PCF-H190DL ([id number]) Colonoscope was                            introduced through the anus and advanced to the the                            cecum,  identified by appendiceal orifice and                            ileocecal valve. The colonoscopy was performed                            without difficulty. The patient tolerated the                            procedure well. The quality of the bowel                            preparation was good. The ileocecal valve,                            appendiceal orifice, and rectum were photographed. Scope In: 3:01:31 PM Scope Out: 3:31:25 PM Scope Withdrawal Time: 0 hours 22 minutes 5 seconds  Total Procedure Duration: 0 hours 29 minutes 54 seconds  Findings:                 The perianal and digital rectal examinations were                            normal.                           A 3 mm polyp was found in the cecum. The polyp was                            sessile. The polyp was removed with a cold snare.                            Resection and retrieval were complete.                           Two sessile polyps were found in the transverse                            colon. The polyps were 3 mm in size. These polyps  were removed with a cold snare. Resection and                            retrieval were complete.                           Internal hemorrhoids were found during                            retroflexion. The hemorrhoids were small.                           The colon was tortuous.                           The exam was otherwise without abnormality. Complications:            No immediate complications. Estimated blood loss:                            Minimal. Estimated Blood Loss:     Estimated blood loss was minimal. Impression:               - One 3 mm polyp in the cecum, removed with a cold                            snare. Resected and retrieved.                           - Two 3 mm polyps in the transverse colon, removed                            with a cold snare. Resected and retrieved.                           - Internal  hemorrhoids.                           - Tortuous colon.                           - The examination was otherwise normal. Recommendation:           - Patient has a contact number available for                            emergencies. The signs and symptoms of potential                            delayed complications were discussed with the                            patient. Return to normal activities tomorrow.                            Written discharge instructions were provided to the  patient.                           - Resume previous diet.                           - Continue present medications.                           - Await pathology results. Remo Lipps P. [name], MD 07/31/2022 3:35:46 PM This report has been signed electronically.

## 2022-07-31 NOTE — Progress Notes (Signed)
[facility] History and Physical   Primary Care Physician:  Lujean Amel, MD   Reason for Procedure:   Colon cancer screening  Plan:    colonoscopy     HPI: Sabrina Snyder is a 61 y.o. female  here for colonoscopy screening - last exam 11 years ago and reportedly normal. Patient denies any bowel symptoms at this time. No family history of colon cancer known. Otherwise feels well without any cardiopulmonary symptoms.   I have discussed risks / benefits of anesthesia and endoscopic procedure with Nonda Lou and they wish to proceed with the exams as outlined today.    Past Medical History:  Diagnosis Date   CFS (chronic fatigue syndrome)    Fibromyalgia    Hashimoto's disease    HTN (hypertension)    Hyperlipidemia    Hypothyroidism (acquired)    IDA (iron deficiency anemia)     Past Surgical History:  Procedure Laterality Date   EAR CYST EXCISION Bilateral    LAPAROSCOPIC TOTAL HYSTERECTOMY     thyroid ablation     URETERAL STENT PLACEMENT      Prior to Admission medications   Medication Sig Start Date End Date Taking? Authorizing Provider  ALPRAZolam Duanne Moron) 0.5 MG tablet Take 0.5 mg by mouth 2 (two) times daily as needed. 12/24/21  Yes [provider]  amLODipine (NORVASC) 10 MG tablet Take 1 tablet (10 mg total) by mouth daily. 12/27/21 07/31/22 Yes Chandrasekhar, Mahesh A, MD  aspirin EC 81 MG tablet Take 1 tablet (81 mg total) by mouth daily. Swallow whole. 04/17/22  Yes Chandrasekhar, Mahesh A, MD  buPROPion (WELLBUTRIN SR) 150 MG 12 hr tablet Take 150 mg by mouth daily. 09/21/16  Yes Milus Banister, MD  DULoxetine (CYMBALTA) 60 MG capsule TAKE 1 CAPSULE ONCE A DAY BY MOUTH DAILY 02/22/20  Yes Cox, Kirsten, MD  estrogens, conjugated, (PREMARIN) 0.625 MG tablet Take 0.625 mg by mouth daily. Take daily for 21 days then do not take for 7 days. 09/21/16  Yes Milus Banister, MD  gabapentin (NEURONTIN) 300 MG capsule Take 300 mg by mouth in the  morning and at bedtime. 12/23/21  Yes [provider]  hydrochlorothiazide (HYDRODIURIL) 25 MG tablet TAKE 1 TABLET BY MOUTH EVERY DAY 04/01/20  Yes Marge Duncans, PA-C  levothyroxine (SYNTHROID) 150 MCG tablet Take 150 mcg by mouth every morning. 06/27/22  Yes [provider]  losartan (COZAAR) 100 MG tablet Take 100 mg by mouth daily. 11/09/16  Yes [provider]  naltrexone (DEPADE) 50 MG tablet Take by mouth daily.   Yes [provider]  albuterol (VENTOLIN HFA) 108 (90 Base) MCG/ACT inhaler Inhale 1 puff into the lungs every 6 (six) hours as needed for wheezing or shortness of breath. Patient not taking: Reported on 07/28/2022    [provider]  Baclofen 5 MG TABS Take 1 tablet by mouth 3 (three) times daily as needed. Patient not taking: Reported on 07/28/2022 12/23/21   [provider]  cholecalciferol (VITAMIN D3) 25 MCG (1000 UNIT) tablet daily at 6 (six) AM.    [provider]  cyanocobalamin (,VITAMIN B-12,) 1000 MCG/ML injection as directed. 01/11/18   [provider]  EPINEPHrine 0.3 mg/0.3 mL IJ SOAJ injection Inject 0.3 mg into the muscle as needed for anaphylaxis.    [provider]  Omega-3 Fatty Acids (SALMON OIL-1000 PO) Take 1 tablet by mouth daily.    [provider]    Current Outpatient Medications  Medication Sig Dispense Refill   ALPRAZolam (XANAX) 0.5 MG tablet Take 0.5 mg by mouth 2 (two) times daily as needed.     amLODipine (NORVASC) 10 MG tablet Take 1 tablet (10 mg total) by mouth daily. 180 tablet 3   aspirin EC 81 MG tablet Take 1 tablet (81 mg total) by mouth daily. Swallow whole. 90 tablet 3   buPROPion (WELLBUTRIN SR) 150 MG 12 hr tablet Take 150 mg by mouth daily.     DULoxetine (CYMBALTA) 60 MG capsule TAKE 1 CAPSULE ONCE A DAY BY MOUTH DAILY 90 capsule 1   estrogens, conjugated, (PREMARIN) 0.625 MG tablet Take 0.625 mg by mouth daily. Take daily for 21 days then do not take  for 7 days.     gabapentin (NEURONTIN) 300 MG capsule Take 300 mg by mouth in the morning and at bedtime.     hydrochlorothiazide (HYDRODIURIL) 25 MG tablet TAKE 1 TABLET BY MOUTH EVERY DAY 90 tablet 0   levothyroxine (SYNTHROID) 150 MCG tablet Take 150 mcg by mouth every morning.     losartan (COZAAR) 100 MG tablet Take 100 mg by mouth daily.     naltrexone (DEPADE) 50 MG tablet Take by mouth daily.     albuterol (VENTOLIN HFA) 108 (90 Base) MCG/ACT inhaler Inhale 1 puff into the lungs every 6 (six) hours as needed for wheezing or shortness of breath. (Patient not taking: Reported on 07/28/2022)     Baclofen 5 MG TABS Take 1 tablet by mouth 3 (three) times daily as needed. (Patient not taking: Reported on 07/28/2022)     cholecalciferol (VITAMIN D3) 25 MCG (1000 UNIT) tablet daily at 6 (six) AM.     cyanocobalamin (,VITAMIN B-12,) 1000 MCG/ML injection as directed.  11   EPINEPHrine 0.3 mg/0.3 mL IJ SOAJ injection Inject 0.3 mg into the muscle as needed for anaphylaxis.     Omega-3 Fatty Acids (SALMON OIL-1000 PO) Take 1 tablet by mouth daily.     Current Facility-Administered Medications  Medication Dose Route Frequency Provider Last Rate Last Admin   0.9 %  sodium chloride infusion  500 mL Intravenous Once Ennis Delpozo, Carlota Raspberry, MD        Allergies as of 07/31/2022 - Review Complete 07/31/2022  Allergen Reaction Noted   Codeine Nausea And Vomiting 01/11/2017   Doxycycline Nausea And Vomiting 04/14/2020   Sulfa antibiotics Other (See Comments) 01/11/2017   Ace inhibitors Other (See Comments) and Swelling 08/21/2020   Nitrofurantoin  03/14/2022   Other Nausea Only and Rash 08/21/2020    Family History  Problem Relation Age of Onset   Colon polyps Mother    Atrial fibrillation Mother    Hypertension Mother    Pancreatic cancer Father    Heart disease Father    Hyperlipidemia Father    Diabetes Father    Hypertension Father    Hypertension Sister    Schizophrenia Sister     Hypertension Sister    Colon polyps Brother    Hypertension Brother    Colon cancer Neg Hx    Esophageal cancer Neg Hx    Stomach cancer Neg Hx    Rectal cancer Neg Hx     Social History   Socioeconomic History   Marital status: Single    Spouse name: Not on file   Number of children: Not on file   Years of education: Not on file   Highest education level: Not on file  Occupational History   Occupation: disabled  Tobacco Use  Smoking status: Never   Smokeless tobacco: Never  Vaping Use   Vaping Use: Never used  Substance and Sexual Activity   Alcohol use: No   Drug use: No   Sexual activity: Yes    Partners: Male    Birth control/protection: Surgical  Other Topics Concern   Not on file  Social History Narrative   Not on file   Social Determinants of Health   Financial Resource Strain: Not on file  Food Insecurity: Not on file  Transportation Needs: Not on file  Physical Activity: Not on file  Stress: Not on file  Social Connections: Not on file  Intimate Partner Violence: Not on file    Review of Systems: All other review of systems negative except as mentioned in the HPI.  Physical Exam: Vital signs BP (!) 142/95   Pulse 85   Temp (!) 97.3 F (36.3 C)   Ht 5' 2.5" (1.588 m)   Wt 164 lb (74.4 kg)   SpO2 97%   BMI 29.52 kg/m   General:   Alert,  Well-developed, pleasant and cooperative in NAD Lungs:  Clear throughout to auscultation.   Heart:  Regular rate and rhythm Abdomen:  Soft, nontender and nondistended.   Neuro/Psych:  Alert and cooperative. Normal mood and affect. A and O x 3  Jolly Mango, MD [facility]

## 2022-07-31 NOTE — Progress Notes (Signed)
Called to room to assist during endoscopic procedure.  Patient ID and intended procedure confirmed with present staff. Received instructions for my participation in the procedure from the performing physician.  

## 2022-07-31 NOTE — Progress Notes (Signed)
Pt's states no medical or surgical changes since previsit or office visit. 

## 2022-07-31 NOTE — Progress Notes (Signed)
PT taken to PACU. Monitors in place. VSS. Report given to RN. 

## 2022-07-31 NOTE — Patient Instructions (Signed)
Handout on polyps given to you today   YOU HAD AN ENDOSCOPIC PROCEDURE TODAY AT THE  ENDOSCOPY CENTER:   Refer to the procedure report that was given to you for any specific questions about what was found during the examination.  If the procedure report does not answer your questions, please call your gastroenterologist to clarify.  If you requested that your care partner not be given the details of your procedure findings, then the procedure report has been included in a sealed envelope for you to review at your convenience later.  YOU SHOULD EXPECT: Some feelings of bloating in the abdomen. Passage of more gas than usual.  Walking can help get rid of the air that was put into your GI tract during the procedure and reduce the bloating. If you had a lower endoscopy (such as a colonoscopy or flexible sigmoidoscopy) you may notice spotting of blood in your stool or on the toilet paper. If you underwent a bowel prep for your procedure, you may not have a normal bowel movement for a few days.  Please Note:  You might notice some irritation and congestion in your nose or some drainage.  This is from the oxygen used during your procedure.  There is no need for concern and it should clear up in a day or so.  SYMPTOMS TO REPORT IMMEDIATELY:  Following lower endoscopy (colonoscopy or flexible sigmoidoscopy):  Excessive amounts of blood in the stool  Significant tenderness or worsening of abdominal pains  Swelling of the abdomen that is new, acute  Fever of 100F or higher  For urgent or emergent issues, a gastroenterologist can be reached at any hour by calling [phone]. Do not use MyChart messaging for urgent concerns.    DIET:  We do recommend a small meal at first, but then you may proceed to your regular diet.  Drink plenty of fluids but you should avoid alcoholic beverages for 24 hours.  ACTIVITY:  You should plan to take it easy for the rest of today and you should NOT DRIVE or use  heavy machinery until tomorrow (because of the sedation medicines used during the test).    FOLLOW UP: Our staff will call the number listed on your records the next business day following your procedure.  We will call around 7:15- 8:00 am to check on you and address any questions or concerns that you may have regarding the information given to you following your procedure. If we do not reach you, we will leave a message.     If any biopsies were taken you will be contacted by phone or by letter within the next 1-3 weeks.  Please call us at [phone] if you have not heard about the biopsies in 3 weeks.    SIGNATURES/CONFIDENTIALITY: You and/or your care partner have signed paperwork which will be entered into your electronic medical record.  These signatures attest to the fact that that the information above on your After Visit Summary has been reviewed and is understood.  Full responsibility of the confidentiality of this discharge information lies with you and/or your care-partner.  

## 2022-08-01 ENCOUNTER — Telehealth: Payer: Self-pay

## 2022-08-01 NOTE — Progress Notes (Signed)
No show

## 2022-08-01 NOTE — Telephone Encounter (Signed)
  Follow up Call-     07/31/2022    2:33 PM  Call back number  Post procedure Call Back phone  # [phone]  Permission to leave phone message Yes     Patient questions:  Do you have a fever, pain , or abdominal swelling? No. Pain Score  0 *  Have you tolerated food without any problems? No.  Have you been able to return to your normal activities? No.  Do you have any questions about your discharge instructions: Diet   No. Medications  No. Follow up visit  No.  Do you have questions or concerns about your Care? No.  Actions: * If pain score is 4 or above: No action needed, pain <4.

## 2022-08-08 DIAGNOSIS — N951 Menopausal and female climacteric states: Secondary | ICD-10-CM | POA: Diagnosis not present

## 2022-08-08 DIAGNOSIS — R61 Generalized hyperhidrosis: Secondary | ICD-10-CM | POA: Diagnosis not present

## 2022-08-08 DIAGNOSIS — R350 Frequency of micturition: Secondary | ICD-10-CM | POA: Diagnosis not present

## 2022-08-15 DIAGNOSIS — R928 Other abnormal and inconclusive findings on diagnostic imaging of breast: Secondary | ICD-10-CM | POA: Diagnosis not present

## 2022-08-15 DIAGNOSIS — N6452 Nipple discharge: Secondary | ICD-10-CM | POA: Diagnosis not present

## 2022-08-16 ENCOUNTER — Other Ambulatory Visit: Payer: Self-pay | Admitting: Family Medicine

## 2022-08-16 DIAGNOSIS — N6452 Nipple discharge: Secondary | ICD-10-CM

## 2022-08-31 DIAGNOSIS — G8929 Other chronic pain: Secondary | ICD-10-CM | POA: Diagnosis not present

## 2022-08-31 DIAGNOSIS — G894 Chronic pain syndrome: Secondary | ICD-10-CM | POA: Diagnosis not present

## 2022-08-31 DIAGNOSIS — R030 Elevated blood-pressure reading, without diagnosis of hypertension: Secondary | ICD-10-CM | POA: Diagnosis not present

## 2022-08-31 DIAGNOSIS — E039 Hypothyroidism, unspecified: Secondary | ICD-10-CM | POA: Diagnosis not present

## 2022-08-31 DIAGNOSIS — Z6829 Body mass index (BMI) 29.0-29.9, adult: Secondary | ICD-10-CM | POA: Diagnosis not present

## 2022-08-31 DIAGNOSIS — M545 Low back pain, unspecified: Secondary | ICD-10-CM | POA: Diagnosis not present

## 2022-08-31 DIAGNOSIS — Z79899 Other long term (current) drug therapy: Secondary | ICD-10-CM | POA: Diagnosis not present

## 2022-09-05 ENCOUNTER — Other Ambulatory Visit: Payer: Medicare HMO

## 2022-09-06 ENCOUNTER — Other Ambulatory Visit: Payer: Medicare HMO

## 2022-09-26 ENCOUNTER — Ambulatory Visit
Admission: RE | Admit: 2022-09-26 | Discharge: 2022-09-26 | Disposition: A | Discharge status: Home / Self Care | Payer: Medicare HMO | Source: Ambulatory Visit | Attending: Family Medicine | Admitting: Family Medicine

## 2022-09-26 DIAGNOSIS — N6489 Other specified disorders of breast: Secondary | ICD-10-CM | POA: Diagnosis not present

## 2022-09-26 DIAGNOSIS — N6452 Nipple discharge: Secondary | ICD-10-CM

## 2022-09-26 MED ORDER — GADOPICLENOL 0.5 MMOL/ML IV SOLN
7.5000 mL | Freq: Once | INTRAVENOUS | Status: AC | PRN
Start: 2022-09-26 — End: 2022-09-26
  Administered 2022-09-26: 7.5 mL via INTRAVENOUS

## 2022-10-02 DIAGNOSIS — M5416 Radiculopathy, lumbar region: Secondary | ICD-10-CM | POA: Diagnosis not present

## 2022-10-17 DIAGNOSIS — M5416 Radiculopathy, lumbar region: Secondary | ICD-10-CM | POA: Diagnosis not present

## 2022-10-25 DIAGNOSIS — N390 Urinary tract infection, site not specified: Secondary | ICD-10-CM | POA: Diagnosis not present

## 2022-11-02 DIAGNOSIS — E039 Hypothyroidism, unspecified: Secondary | ICD-10-CM | POA: Diagnosis not present

## 2022-11-02 DIAGNOSIS — R69 Illness, unspecified: Secondary | ICD-10-CM | POA: Diagnosis not present

## 2022-11-02 DIAGNOSIS — I10 Essential (primary) hypertension: Secondary | ICD-10-CM | POA: Diagnosis not present

## 2022-11-02 DIAGNOSIS — G894 Chronic pain syndrome: Secondary | ICD-10-CM | POA: Diagnosis not present

## 2022-11-02 DIAGNOSIS — N6452 Nipple discharge: Secondary | ICD-10-CM | POA: Diagnosis not present

## 2022-11-13 HISTORY — PX: COLONOSCOPY: SHX174

## 2022-11-16 ENCOUNTER — Telehealth: Payer: Self-pay | Admitting: Internal Medicine

## 2022-11-16 MED ORDER — PRAVASTATIN SODIUM 20 MG PO TABS: 20.0000 mg | ORAL_TABLET | Freq: Every evening | ORAL | 11 refills | Status: DC

## 2022-11-16 NOTE — Telephone Encounter (Signed)
Pt c/o medication issue:  1. Name of Medication:  Rosuvastatin 5 mg   2. How are you currently taking this medication (dosage and times per day)? 1 tablet daily  3. Are you having a reaction (difficulty breathing--STAT)? no  4. What is your medication issue? Patient states she stopped the medication, because it gave her really bad muscle cramps. She would like to know if there is another medication she can take for cholesterol that is not a statin.

## 2022-11-16 NOTE — Telephone Encounter (Signed)
Rosuvastatin is the only cholesterol med I see that pt has tried unless her PCP has tried her on others and they aren't in Epic. If she hasn't tried other cholesterol meds, would see if she's willing to try pravastatin '20mg'$  daily. Statins have better cardiovascular risk reduction data than ezetimibe, which would be the next line option. Typically prefer pts to have tried at least 2 statins before moving on to ezetimibe though.

## 2022-11-16 NOTE — Telephone Encounter (Signed)
Left a message to call back.

## 2022-11-16 NOTE — Telephone Encounter (Signed)
Called pt advised of pharmacist response:   Rosuvastatin is the only cholesterol med I see that pt has tried unless her PCP has tried her on others and they aren't in Epic. If she hasn't tried other cholesterol meds, would see if she's willing to try pravastatin '20mg'$  daily. Statins have better cardiovascular risk reduction data than ezetimibe, which would be the next line option. Typically prefer pts to have tried at least 2 statins before moving on to ezetimibe though.    Pt is agreeable to trial pravastatin 20 mg PO QD.  Will start pravastatin on Monday 11/20/22.  Script sent to pharmacy of pt choice.

## 2022-11-30 DIAGNOSIS — Z6830 Body mass index (BMI) 30.0-30.9, adult: Secondary | ICD-10-CM | POA: Diagnosis not present

## 2022-11-30 DIAGNOSIS — M5416 Radiculopathy, lumbar region: Secondary | ICD-10-CM | POA: Diagnosis not present

## 2022-12-05 DIAGNOSIS — J4520 Mild intermittent asthma, uncomplicated: Secondary | ICD-10-CM | POA: Diagnosis not present

## 2022-12-05 DIAGNOSIS — R400 Somnolence: Secondary | ICD-10-CM | POA: Diagnosis not present

## 2022-12-05 DIAGNOSIS — G4733 Obstructive sleep apnea (adult) (pediatric): Secondary | ICD-10-CM | POA: Diagnosis not present

## 2022-12-05 DIAGNOSIS — R5383 Other fatigue: Secondary | ICD-10-CM | POA: Diagnosis not present

## 2022-12-19 ENCOUNTER — Telehealth: Payer: Self-pay | Admitting: Internal Medicine

## 2022-12-19 NOTE — Telephone Encounter (Signed)
Pt c/o medication issue:  1. Name of Medication: Pravastatin  2. How are you currently taking this medication (dosage and times per day)? 1 time a day  3. Are you having a reaction (difficulty breathing--STAT)?   4. What is your medication issue? Making her muscles ache really bad- is there any other medicine than the statins that she can take?

## 2022-12-19 NOTE — Telephone Encounter (Signed)
Left a message to call back

## 2022-12-20 DIAGNOSIS — M4807 Spinal stenosis, lumbosacral region: Secondary | ICD-10-CM | POA: Diagnosis not present

## 2022-12-20 DIAGNOSIS — N1831 Chronic kidney disease, stage 3a: Secondary | ICD-10-CM | POA: Diagnosis not present

## 2022-12-20 DIAGNOSIS — E559 Vitamin D deficiency, unspecified: Secondary | ICD-10-CM | POA: Diagnosis not present

## 2022-12-20 DIAGNOSIS — E538 Deficiency of other specified B group vitamins: Secondary | ICD-10-CM | POA: Diagnosis not present

## 2022-12-20 DIAGNOSIS — I700 Atherosclerosis of aorta: Secondary | ICD-10-CM | POA: Diagnosis not present

## 2022-12-20 DIAGNOSIS — G720 Drug-induced myopathy: Secondary | ICD-10-CM | POA: Diagnosis not present

## 2022-12-20 DIAGNOSIS — E039 Hypothyroidism, unspecified: Secondary | ICD-10-CM | POA: Diagnosis not present

## 2022-12-20 DIAGNOSIS — T466X5A Adverse effect of antihyperlipidemic and antiarteriosclerotic drugs, initial encounter: Secondary | ICD-10-CM | POA: Diagnosis not present

## 2022-12-20 DIAGNOSIS — R69 Illness, unspecified: Secondary | ICD-10-CM | POA: Diagnosis not present

## 2022-12-20 DIAGNOSIS — Z0001 Encounter for general adult medical examination with abnormal findings: Secondary | ICD-10-CM | POA: Diagnosis not present

## 2022-12-20 DIAGNOSIS — E7800 Pure hypercholesterolemia, unspecified: Secondary | ICD-10-CM | POA: Diagnosis not present

## 2022-12-27 ENCOUNTER — Telehealth: Payer: Self-pay | Admitting: Internal Medicine

## 2022-12-27 DIAGNOSIS — I700 Atherosclerosis of aorta: Secondary | ICD-10-CM

## 2022-12-27 DIAGNOSIS — I2510 Atherosclerotic heart disease of native coronary artery without angina pectoris: Secondary | ICD-10-CM

## 2022-12-27 NOTE — Telephone Encounter (Signed)
Pt called in on 2/14 another telephone encounter started.  Please refer to 2/14 encounter for more details.

## 2022-12-27 NOTE — Telephone Encounter (Signed)
Pt c/o medication issue:  1. Name of Medication: pravastatin (PRAVACHOL) 20 MG tablet   2. How are you currently taking this medication (dosage and times per day)? Not currently taking  3. Are you having a reaction (difficulty breathing--STAT)? Yes  4. What is your medication issue? Patient is calling stating she stopped taking this medication due to it causing muscle pain. She is wanting to discuss starting a med that is not a statin.

## 2022-12-27 NOTE — Telephone Encounter (Signed)
Patient stated she is not able to take pravastatin it causes muscle cramps. Pt will like to know if the MD can prescribe another medication. Will forward to MD and nurse.

## 2022-12-27 NOTE — Telephone Encounter (Signed)
Called pt reports pravastatin was causing muscle aches.  Last took pravastatin 20 mg PO QD about a week ago.  Pt would like to know if there is another medication that can replace pravastatin. Will send to MD to advise.

## 2022-12-28 MED ORDER — EZETIMIBE 10 MG PO TABS: 10.0000 mg | ORAL_TABLET | Freq: Every day | ORAL | 3 refills | Status: DC

## 2022-12-28 NOTE — Telephone Encounter (Signed)
Called pt advised of MD recommendation.  Pt is agreeable will have labs at next OV with MD due in June 2024.

## 2022-12-28 NOTE — Telephone Encounter (Signed)
Left a message to call back

## 2022-12-28 NOTE — Telephone Encounter (Signed)
Patient is returning call.  °

## 2022-12-31 DIAGNOSIS — W06XXXA Fall from bed, initial encounter: Secondary | ICD-10-CM | POA: Diagnosis not present

## 2022-12-31 DIAGNOSIS — Z792 Long term (current) use of antibiotics: Secondary | ICD-10-CM | POA: Diagnosis not present

## 2022-12-31 DIAGNOSIS — R519 Headache, unspecified: Secondary | ICD-10-CM | POA: Diagnosis not present

## 2022-12-31 DIAGNOSIS — S060X0A Concussion without loss of consciousness, initial encounter: Secondary | ICD-10-CM | POA: Diagnosis not present

## 2022-12-31 DIAGNOSIS — W19XXXA Unspecified fall, initial encounter: Secondary | ICD-10-CM | POA: Diagnosis not present

## 2022-12-31 DIAGNOSIS — M4802 Spinal stenosis, cervical region: Secondary | ICD-10-CM | POA: Diagnosis not present

## 2022-12-31 DIAGNOSIS — S199XXA Unspecified injury of neck, initial encounter: Secondary | ICD-10-CM | POA: Diagnosis not present

## 2022-12-31 DIAGNOSIS — S0990XA Unspecified injury of head, initial encounter: Secondary | ICD-10-CM | POA: Diagnosis not present

## 2022-12-31 DIAGNOSIS — M47812 Spondylosis without myelopathy or radiculopathy, cervical region: Secondary | ICD-10-CM | POA: Diagnosis not present

## 2022-12-31 DIAGNOSIS — Z79899 Other long term (current) drug therapy: Secondary | ICD-10-CM | POA: Diagnosis not present

## 2023-01-01 ENCOUNTER — Other Ambulatory Visit: Payer: Self-pay | Admitting: Internal Medicine

## 2023-01-17 DIAGNOSIS — H25813 Combined forms of age-related cataract, bilateral: Secondary | ICD-10-CM | POA: Diagnosis not present

## 2023-01-17 DIAGNOSIS — H524 Presbyopia: Secondary | ICD-10-CM | POA: Diagnosis not present

## 2023-01-17 DIAGNOSIS — H52223 Regular astigmatism, bilateral: Secondary | ICD-10-CM | POA: Diagnosis not present

## 2023-01-17 DIAGNOSIS — H5213 Myopia, bilateral: Secondary | ICD-10-CM | POA: Diagnosis not present

## 2023-01-18 DIAGNOSIS — M5416 Radiculopathy, lumbar region: Secondary | ICD-10-CM | POA: Diagnosis not present

## 2023-01-25 DIAGNOSIS — Z0100 Encounter for examination of eyes and vision without abnormal findings: Secondary | ICD-10-CM | POA: Diagnosis not present

## 2023-02-26 DIAGNOSIS — R5383 Other fatigue: Secondary | ICD-10-CM | POA: Diagnosis not present

## 2023-02-26 DIAGNOSIS — J4520 Mild intermittent asthma, uncomplicated: Secondary | ICD-10-CM | POA: Diagnosis not present

## 2023-02-26 DIAGNOSIS — G4733 Obstructive sleep apnea (adult) (pediatric): Secondary | ICD-10-CM | POA: Diagnosis not present

## 2023-02-26 DIAGNOSIS — R400 Somnolence: Secondary | ICD-10-CM | POA: Diagnosis not present

## 2023-03-22 DIAGNOSIS — M5416 Radiculopathy, lumbar region: Secondary | ICD-10-CM | POA: Diagnosis not present

## 2023-03-22 DIAGNOSIS — Z6830 Body mass index (BMI) 30.0-30.9, adult: Secondary | ICD-10-CM | POA: Diagnosis not present

## 2023-04-02 DIAGNOSIS — M545 Low back pain, unspecified: Secondary | ICD-10-CM | POA: Diagnosis not present

## 2023-04-02 DIAGNOSIS — R030 Elevated blood-pressure reading, without diagnosis of hypertension: Secondary | ICD-10-CM | POA: Diagnosis not present

## 2023-04-02 DIAGNOSIS — G8929 Other chronic pain: Secondary | ICD-10-CM | POA: Diagnosis not present

## 2023-04-02 DIAGNOSIS — Z6829 Body mass index (BMI) 29.0-29.9, adult: Secondary | ICD-10-CM | POA: Diagnosis not present

## 2023-04-02 DIAGNOSIS — Z79899 Other long term (current) drug therapy: Secondary | ICD-10-CM | POA: Diagnosis not present

## 2023-04-05 DIAGNOSIS — M5416 Radiculopathy, lumbar region: Secondary | ICD-10-CM | POA: Diagnosis not present

## 2023-04-05 DIAGNOSIS — M5136 Other intervertebral disc degeneration, lumbar region: Secondary | ICD-10-CM | POA: Diagnosis not present

## 2023-04-16 DIAGNOSIS — G4733 Obstructive sleep apnea (adult) (pediatric): Secondary | ICD-10-CM | POA: Diagnosis not present

## 2023-04-17 ENCOUNTER — Other Ambulatory Visit: Payer: Self-pay | Admitting: Internal Medicine

## 2023-04-24 ENCOUNTER — Other Ambulatory Visit: Payer: Self-pay | Admitting: Internal Medicine

## 2023-04-29 DIAGNOSIS — R519 Headache, unspecified: Secondary | ICD-10-CM | POA: Diagnosis not present

## 2023-04-29 DIAGNOSIS — H9203 Otalgia, bilateral: Secondary | ICD-10-CM | POA: Diagnosis not present

## 2023-04-29 DIAGNOSIS — J069 Acute upper respiratory infection, unspecified: Secondary | ICD-10-CM | POA: Diagnosis not present

## 2023-05-10 DIAGNOSIS — M4310 Spondylolisthesis, site unspecified: Secondary | ICD-10-CM | POA: Diagnosis not present

## 2023-05-10 DIAGNOSIS — M48062 Spinal stenosis, lumbar region with neurogenic claudication: Secondary | ICD-10-CM | POA: Diagnosis not present

## 2023-05-10 DIAGNOSIS — M48061 Spinal stenosis, lumbar region without neurogenic claudication: Secondary | ICD-10-CM | POA: Diagnosis not present

## 2023-05-10 DIAGNOSIS — M5416 Radiculopathy, lumbar region: Secondary | ICD-10-CM | POA: Diagnosis not present

## 2023-05-14 IMAGING — CT CT HEART MORP W/ CTA COR W/ SCORE W/ CA W/CM &/OR W/O CM
4 of 7 series · 8 of 20 positions shown, 9 images · IV contrast (APPLIED)
Comparison: CT October 29, 2020

Addendum:
CLINICAL DATA: Chest pain

EXAM:
Cardiac CTA
MEDICATIONS:
Sub lingual nitro. 4mg and lopressor 100mg
TECHNIQUE: The patient was scanned on a Siemens Force [REDACTED]ice scanner. Gantry
rotation speed was 250 msecs. Collimation was .6 mm. A 100 kV
prospective scan was triggered in the ascending thoracic aorta at
140 HU's Full mA was used between 35% and 75% of the R-R interval.
Average HR during the scan was 65 bpm. The 3D data set was
interpreted on a dedicated work station using MPR, MIP and VRT
modes. A total of 80 cc of contrast was used.

[Series 6: ts diast sharp · axial · 0.36mm/px · z∈[-307,-275]mm · 2 of 238 slices shown]
[im 80/238  lung]
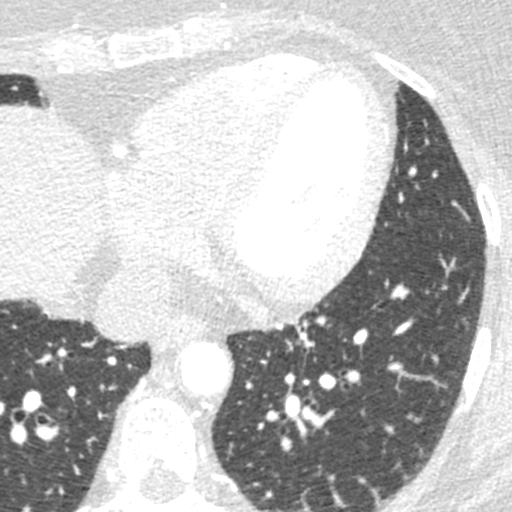
[im 159/238  lung]
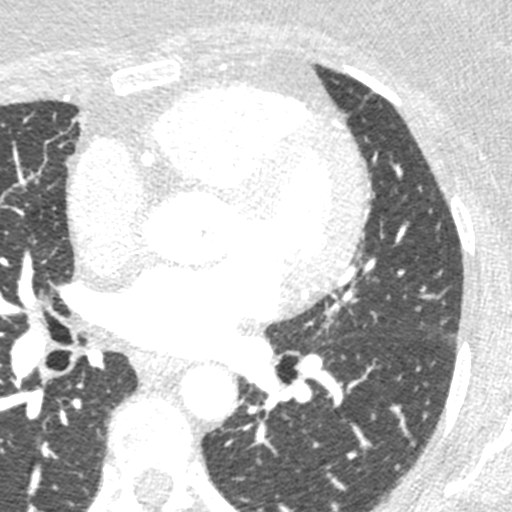

[Series 7: best diast · axial · 0.36mm/px · z∈[-307,-275]mm · 2 of 238 slices shown, 3 images]
[im 80/238  vessel]
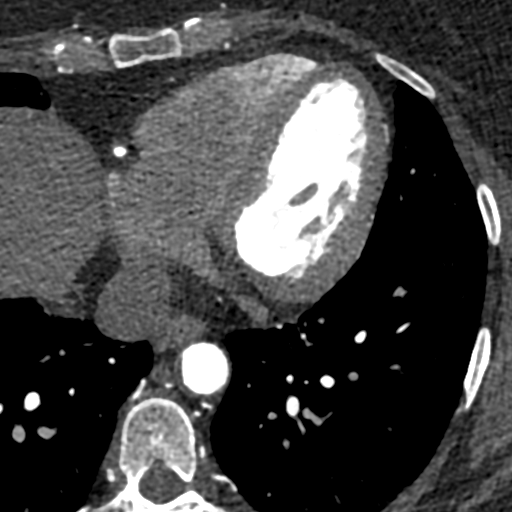
[im 80/238  lung]
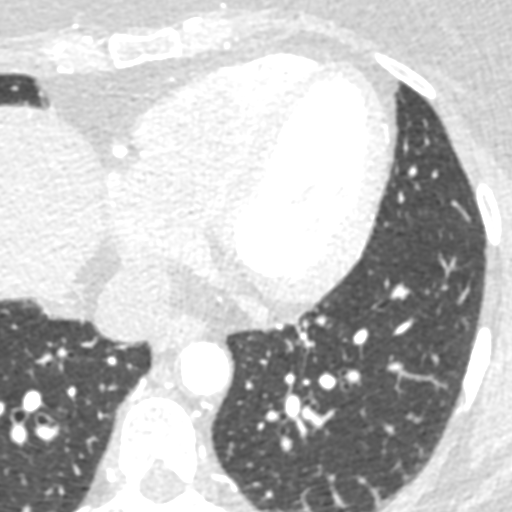
[im 159/238  vessel]
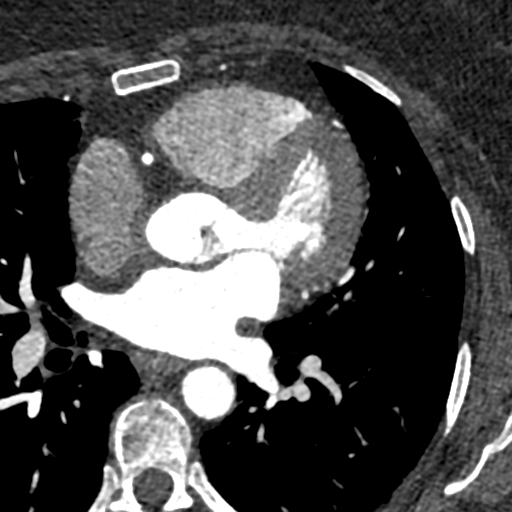

[Series 8: ts syst sharp · axial · 0.36mm/px · z∈[-307,-275]mm · 2 of 238 slices shown]
[im 80/238  lung]
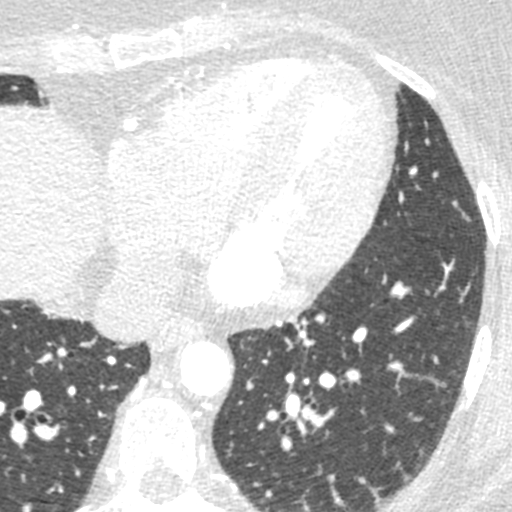
[im 159/238  lung]
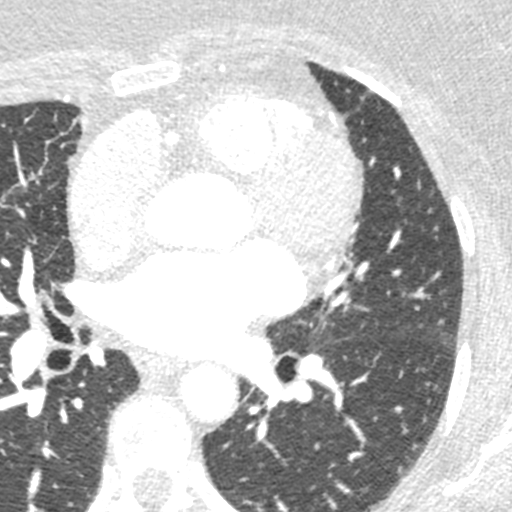

[Series 9: best syst · axial · 0.36mm/px · z∈[-307,-275]mm · 2 of 238 slices shown]
[im 80/238  vessel]
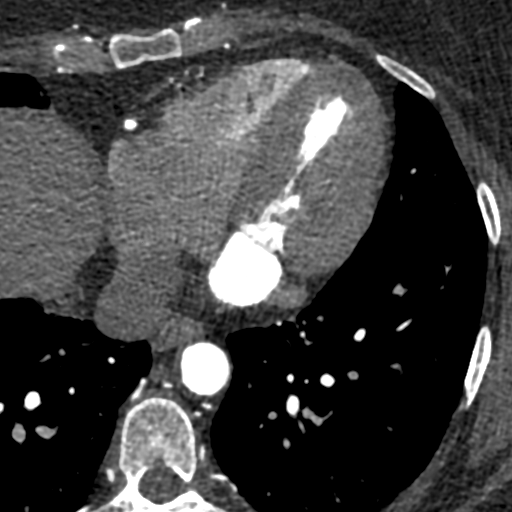
[im 159/238  vessel]
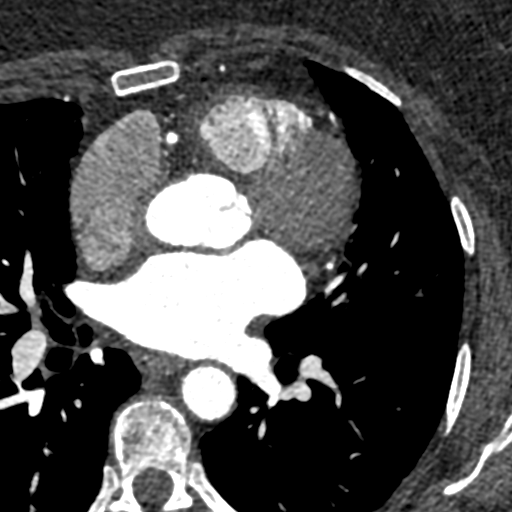

[8 of 20 positions shown; findings below may reference images not displayed]

FINDINGS: Non-cardiac: See separate report from [REDACTED]. No
significant findings on limited lung and soft tissue windows.

Calcium Score: Noted only in proximal LAD

Coronary Arteries: Right dominant with no anomalies

LM: Normal

LAD: 1-24% mixed plaque in ostial/proximal LAD

D1: Normal

D2: Normal

Circumflex: Normal

OM1: Normal

OM2: Normal

RCA: Normal

PDA: Normal

PLA: Normal
IMPRESSION: 1. Calcium score 14 isolated to LAD this is 76 th percentile for age
/ sex

2.  CAD RADS 1 non obstructive CAD in LAD see description above

3.  Normal ascending thoracic aorta 2.9 cm

Tavares Monteiro Kurbanalieva

ADDENDUM:
The following report is an over-read performed by radiologist Dr.
over-read does not include interpretation of cardiac or coronary
anatomy or pathology. The coronary calcium score/coronary CTA
interpretation by the cardiologist is attached.
FINDINGS: Vascular: No acute non-cardiac vascular finding.

Mediastinum/Nodes: No pathologically enlarged mediastinal, or hilar
lymph nodes, noting limited sensitivity for the detection of hilar
adenopathy on this noncontrast study. Visualized portions of the
esophagus are grossly unremarkable

Lungs/Pleura: Within the visualized portions of the thorax there are
no suspicious appearing pulmonary nodules or masses, there is no
acute
consolidative airspace disease, no pleural effusions and no
pneumothorax

Upper Abdomen: Visualized portions of the upper abdomen are
unremarkable.

Musculoskeletal: There are no aggressive appearing lytic or blastic
lesions noted in the visualized portions of the skeleton.
IMPRESSION: No significant incidental noncardiac finding noted.

*** End of Addendum ***
FINDINGS: Non-cardiac: See separate report from [REDACTED]. No
significant findings on limited lung and soft tissue windows.

Calcium Score: Noted only in proximal LAD

Coronary Arteries: Right dominant with no anomalies

LM: Normal

LAD: 1-24% mixed plaque in ostial/proximal LAD

D1: Normal

D2: Normal

Circumflex: Normal

OM1: Normal

OM2: Normal

RCA: Normal

PDA: Normal

PLA: Normal
IMPRESSION: 1. Calcium score 14 isolated to LAD this is 76 th percentile for age
/ sex

2.  CAD RADS 1 non obstructive CAD in LAD see description above

3.  Normal ascending thoracic aorta 2.9 cm

Tavares Monteiro Kurbanalieva

## 2023-05-29 DIAGNOSIS — M48062 Spinal stenosis, lumbar region with neurogenic claudication: Secondary | ICD-10-CM | POA: Diagnosis not present

## 2023-06-08 DIAGNOSIS — H6691 Otitis media, unspecified, right ear: Secondary | ICD-10-CM | POA: Diagnosis not present

## 2023-06-08 DIAGNOSIS — F411 Generalized anxiety disorder: Secondary | ICD-10-CM | POA: Diagnosis not present

## 2023-06-18 DIAGNOSIS — R400 Somnolence: Secondary | ICD-10-CM | POA: Diagnosis not present

## 2023-06-18 DIAGNOSIS — G4733 Obstructive sleep apnea (adult) (pediatric): Secondary | ICD-10-CM | POA: Diagnosis not present

## 2023-06-18 DIAGNOSIS — R5383 Other fatigue: Secondary | ICD-10-CM | POA: Diagnosis not present

## 2023-06-18 DIAGNOSIS — J4520 Mild intermittent asthma, uncomplicated: Secondary | ICD-10-CM | POA: Diagnosis not present

## 2023-06-20 DIAGNOSIS — E7800 Pure hypercholesterolemia, unspecified: Secondary | ICD-10-CM | POA: Diagnosis not present

## 2023-06-20 DIAGNOSIS — N1831 Chronic kidney disease, stage 3a: Secondary | ICD-10-CM | POA: Diagnosis not present

## 2023-06-20 DIAGNOSIS — I700 Atherosclerosis of aorta: Secondary | ICD-10-CM | POA: Diagnosis not present

## 2023-06-20 DIAGNOSIS — E039 Hypothyroidism, unspecified: Secondary | ICD-10-CM | POA: Diagnosis not present

## 2023-06-20 DIAGNOSIS — F411 Generalized anxiety disorder: Secondary | ICD-10-CM | POA: Diagnosis not present

## 2023-07-06 ENCOUNTER — Other Ambulatory Visit: Payer: Self-pay | Admitting: Internal Medicine

## 2023-07-06 DIAGNOSIS — H7311 Chronic myringitis, right ear: Secondary | ICD-10-CM | POA: Diagnosis not present

## 2023-07-06 DIAGNOSIS — H6993 Unspecified Eustachian tube disorder, bilateral: Secondary | ICD-10-CM | POA: Diagnosis not present

## 2023-07-09 DIAGNOSIS — M545 Low back pain, unspecified: Secondary | ICD-10-CM | POA: Diagnosis not present

## 2023-07-09 DIAGNOSIS — Z6829 Body mass index (BMI) 29.0-29.9, adult: Secondary | ICD-10-CM | POA: Diagnosis not present

## 2023-07-09 DIAGNOSIS — G894 Chronic pain syndrome: Secondary | ICD-10-CM | POA: Diagnosis not present

## 2023-07-09 DIAGNOSIS — Z79899 Other long term (current) drug therapy: Secondary | ICD-10-CM | POA: Diagnosis not present

## 2023-07-09 DIAGNOSIS — G8929 Other chronic pain: Secondary | ICD-10-CM | POA: Diagnosis not present

## 2023-07-09 DIAGNOSIS — R030 Elevated blood-pressure reading, without diagnosis of hypertension: Secondary | ICD-10-CM | POA: Diagnosis not present

## 2023-07-13 DIAGNOSIS — Z01818 Encounter for other preprocedural examination: Secondary | ICD-10-CM | POA: Diagnosis not present

## 2023-08-02 DIAGNOSIS — Z6830 Body mass index (BMI) 30.0-30.9, adult: Secondary | ICD-10-CM | POA: Diagnosis not present

## 2023-08-02 DIAGNOSIS — M4310 Spondylolisthesis, site unspecified: Secondary | ICD-10-CM | POA: Diagnosis not present

## 2023-08-02 DIAGNOSIS — M48062 Spinal stenosis, lumbar region with neurogenic claudication: Secondary | ICD-10-CM | POA: Diagnosis not present

## 2023-08-06 ENCOUNTER — Other Ambulatory Visit: Payer: Self-pay | Admitting: Internal Medicine

## 2023-08-15 DIAGNOSIS — N6452 Nipple discharge: Secondary | ICD-10-CM | POA: Diagnosis not present

## 2023-08-15 DIAGNOSIS — N6042 Mammary duct ectasia of left breast: Secondary | ICD-10-CM | POA: Diagnosis not present

## 2023-08-15 DIAGNOSIS — Z1231 Encounter for screening mammogram for malignant neoplasm of breast: Secondary | ICD-10-CM | POA: Diagnosis not present

## 2023-08-17 ENCOUNTER — Other Ambulatory Visit: Payer: Self-pay | Admitting: Neurosurgery

## 2023-08-19 ENCOUNTER — Other Ambulatory Visit: Payer: Self-pay | Admitting: Internal Medicine

## 2023-08-21 DIAGNOSIS — E039 Hypothyroidism, unspecified: Secondary | ICD-10-CM | POA: Diagnosis not present

## 2023-08-23 DIAGNOSIS — H7311 Chronic myringitis, right ear: Secondary | ICD-10-CM | POA: Diagnosis not present

## 2023-08-23 DIAGNOSIS — Z9089 Acquired absence of other organs: Secondary | ICD-10-CM | POA: Diagnosis not present

## 2023-08-23 DIAGNOSIS — H6993 Unspecified Eustachian tube disorder, bilateral: Secondary | ICD-10-CM | POA: Diagnosis not present

## 2023-08-23 DIAGNOSIS — H906 Mixed conductive and sensorineural hearing loss, bilateral: Secondary | ICD-10-CM | POA: Diagnosis not present

## 2023-08-27 DIAGNOSIS — M4316 Spondylolisthesis, lumbar region: Secondary | ICD-10-CM | POA: Diagnosis not present

## 2023-08-27 NOTE — Pre-Procedure Instructions (Addendum)
Surgical Instructions   Your procedure is scheduled on September 03, 2023. Report to [facility] Main Entrance "A" at 8:30 A.M., then check in with the Admitting office. Any questions or running late day of surgery: call [phone]  Questions prior to your surgery date: call [phone], Monday-Friday, 8am-4pm. If you experience any cold or flu symptoms such as cough, fever, chills, shortness of breath, etc. between now and your scheduled surgery, please notify us at the above number.     Remember:  Do not eat or drink after midnight the night before your surgery    Take these medicines the morning of surgery with A SIP OF WATER: amLODipine (NORVASC)  buPROPion (WELLBUTRIN XL)  DULoxetine (CYMBALTA)  ezetimibe (ZETIA)  gabapentin (NEURONTIN)  levothyroxine (SYNTHROID)  loratadine (CLARITIN)    May take these medicines IF NEEDED: acetaminophen (TYLENOL)  albuterol (VENTOLIN HFA) inhaler  ALPRAZolam (XANAX)  Baclofen  EPINEPHrine Pen   Follow your surgeon's instructions on when to stop Asprin.  If no instructions were given by your surgeon then you will need to call the office to get those instructions.     Follow your prescriber's instructions on if/when to stop Naltrexone HCl (NALTREX). Also, be sure to discuss post-surgical pain management plan with surgeon and prescriber.   One week prior to surgery, STOP taking any Aleve, Naproxen, Ibuprofen, Motrin, Advil, Goody's, BC's, all herbal medications, fish oil, and non-prescription vitamins.                     Do NOT Smoke (Tobacco/Vaping) for 24 hours prior to your procedure.  If you use a CPAP at night, you may bring your mask/headgear for your overnight stay.   You will be asked to remove any contacts, glasses, piercing's, hearing aid's, dentures/partials prior to surgery. Please bring cases for these items if needed.    Patients discharged the day of surgery will not be allowed to drive home, and someone needs to  stay with them for 24 hours.  SURGICAL WAITING ROOM VISITATION Patients may have no more than 2 support people in the waiting area - these visitors may rotate.   Pre-op nurse will coordinate an appropriate time for 1 ADULT support person, who may not rotate, to accompany patient in pre-op.  Children under the age of 31 must have an adult with them who is not the patient and must remain in the main waiting area with an adult.  If the patient needs to stay at the hospital during part of their recovery, the visitor guidelines for inpatient rooms apply.  Please refer to the [facility] website for the visitor guidelines for any additional information.   If you received a COVID test during your pre-op visit  it is requested that you wear a mask when out in public, stay away from anyone that may not be feeling well and notify your surgeon if you develop symptoms. If you have been in contact with anyone that has tested positive in the last 10 days please notify you surgeon.      Pre-operative 5 CHG Bathing Instructions   You can play a key role in reducing the risk of infection after surgery. Your skin needs to be as free of germs as possible. You can reduce the number of germs on your skin by washing with CHG (chlorhexidine gluconate) soap before surgery. CHG is an antiseptic soap that kills germs and continues to kill germs even after washing.   DO NOT use if you  have an allergy to chlorhexidine/CHG or antibacterial soaps. If your skin becomes reddened or irritated, stop using the CHG and notify one of our RNs at [phone].   Please shower with the CHG soap starting 4 days before surgery using the following schedule:     Please keep in mind the following:  DO NOT shave, including legs and underarms, starting the day of your first shower.   You may shave your face at any point before/day of surgery.  Place clean sheets on your bed the day you start using CHG soap. Use a clean washcloth  (not used since being washed) for each shower. DO NOT sleep with pets once you start using the CHG.   CHG Shower Instructions:  Wash your face and private area with normal soap. If you choose to wash your hair, wash first with your normal shampoo.  After you use shampoo/soap, rinse your hair and body thoroughly to remove shampoo/soap residue.  Turn the water OFF and apply about 3 tablespoons (45 ml) of CHG soap to a CLEAN washcloth.  Apply CHG soap ONLY FROM YOUR NECK DOWN TO YOUR TOES (washing for 3-5 minutes)  DO NOT use CHG soap on face, private areas, open wounds, or sores.  Pay special attention to the area where your surgery is being performed.  If you are having back surgery, having someone wash your back for you may be helpful. Wait 2 minutes after CHG soap is applied, then you may rinse off the CHG soap.  Pat dry with a clean towel  Put on clean clothes/pajamas   If you choose to wear lotion, please use ONLY the CHG-compatible lotions on the back of this paper.   Additional instructions for the day of surgery: DO NOT APPLY any lotions, deodorants, cologne, or perfumes.   Do not bring valuables to the hospital. [facility] is not responsible for any belongings/valuables. Do not wear nail polish, gel polish, artificial nails, or any other type of covering on natural nails (fingers and toes) Do not wear jewelry or makeup Put on clean/comfortable clothes.  Please brush your teeth.  Ask your nurse before applying any prescription medications to the skin.     CHG Compatible Lotions   Aveeno Moisturizing lotion  Cetaphil Moisturizing Cream  Cetaphil Moisturizing Lotion  Clairol Herbal Essence Moisturizing Lotion, Dry Skin  Clairol Herbal Essence Moisturizing Lotion, Extra Dry Skin  Clairol Herbal Essence Moisturizing Lotion, Normal Skin  Curel Age Defying Therapeutic Moisturizing Lotion with Alpha Hydroxy  Curel Extreme Care Body Lotion  Curel Soothing Hands Moisturizing Hand  Lotion  Curel Therapeutic Moisturizing Cream, Fragrance-Free  Curel Therapeutic Moisturizing Lotion, Fragrance-Free  Curel Therapeutic Moisturizing Lotion, Original Formula  Eucerin Daily Replenishing Lotion  Eucerin Dry Skin Therapy Plus Alpha Hydroxy Crme  Eucerin Dry Skin Therapy Plus Alpha Hydroxy Lotion  Eucerin Original Crme  Eucerin Original Lotion  Eucerin Plus Crme Eucerin Plus Lotion  Eucerin TriLipid Replenishing Lotion  Keri Anti-Bacterial Hand Lotion  Keri Deep Conditioning Original Lotion Dry Skin Formula Softly Scented  Keri Deep Conditioning Original Lotion, Fragrance Free Sensitive Skin Formula  Keri Lotion Fast Absorbing Fragrance Free Sensitive Skin Formula  Keri Lotion Fast Absorbing Softly Scented Dry Skin Formula  Keri Original Lotion  Keri Skin Renewal Lotion Keri Silky Smooth Lotion  Keri Silky Smooth Sensitive Skin Lotion  Nivea Body Creamy Conditioning Oil  Nivea Body Extra Enriched Teacher, adult education Moisturizing Lotion Nivea Crme  Nivea Skin Firming  Lotion  NutraDerm 30 Skin Lotion  NutraDerm Skin Lotion  NutraDerm Therapeutic Skin Cream  NutraDerm Therapeutic Skin Lotion  ProShield Protective Hand Cream  Provon moisturizing lotion  Please read over the following fact sheets that you were given.

## 2023-08-28 ENCOUNTER — Encounter (HOSPITAL_COMMUNITY)
Admission: RE | Admit: 2023-08-28 | Discharge: 2023-08-28 | Disposition: A | Discharge status: Home / Self Care | Payer: Medicare HMO | Source: Ambulatory Visit | Attending: Neurosurgery | Admitting: Neurosurgery

## 2023-08-28 ENCOUNTER — Other Ambulatory Visit: Payer: Self-pay

## 2023-08-28 ENCOUNTER — Encounter (HOSPITAL_COMMUNITY): Payer: Self-pay

## 2023-08-28 VITALS — BP 120/71 | HR 82 | Temp 98.2°F | Resp 18 | Ht 62.0 in | Wt 165.9 lb

## 2023-08-28 DIAGNOSIS — Z01818 Encounter for other preprocedural examination: Secondary | ICD-10-CM | POA: Insufficient documentation

## 2023-08-28 DIAGNOSIS — R9431 Abnormal electrocardiogram [ECG] [EKG]: Secondary | ICD-10-CM | POA: Insufficient documentation

## 2023-08-28 DIAGNOSIS — I2510 Atherosclerotic heart disease of native coronary artery without angina pectoris: Secondary | ICD-10-CM | POA: Diagnosis not present

## 2023-08-28 DIAGNOSIS — Z01812 Encounter for preprocedural laboratory examination: Secondary | ICD-10-CM | POA: Diagnosis present

## 2023-08-28 DIAGNOSIS — Z01810 Encounter for preprocedural cardiovascular examination: Secondary | ICD-10-CM | POA: Diagnosis present

## 2023-08-28 HISTORY — DX: Personal history of urinary calculi: Z87.442

## 2023-08-28 HISTORY — DX: Unspecified osteoarthritis, unspecified site: M19.90

## 2023-08-28 HISTORY — DX: Sleep apnea, unspecified: G47.30

## 2023-08-28 HISTORY — DX: Unspecified asthma, uncomplicated: J45.909

## 2023-08-28 HISTORY — DX: Anxiety disorder, unspecified: F41.9

## 2023-08-28 HISTORY — DX: Atherosclerotic heart disease of native coronary artery without angina pectoris: I25.10

## 2023-08-28 LAB — BASIC METABOLIC PANEL
Anion gap: 8 (ref 5–15)
BUN: 11 mg/dL (ref 8–23)
CO2: 26 mmol/L (ref 22–32)
Calcium: 9.4 mg/dL (ref 8.9–10.3)
Chloride: 104 mmol/L (ref 98–111)
Creatinine, Ser: 0.86 mg/dL (ref 0.44–1.00)
GFR, Estimated: >60 mL/min (ref >60)
Glucose, Bld: 90 mg/dL (ref 70–99)
Potassium: 3.2 mmol/L — ABNORMAL LOW (ref 3.5–5.1)
Sodium: 138 mmol/L (ref 135–145)

## 2023-08-28 LAB — TYPE AND SCREEN
ABO/RH(D): A POS
Antibody Screen: NEGATIVE

## 2023-08-28 LAB — CBC
HCT: 38.3 % (ref 36.0–46.0)
Hemoglobin: 11.6 g/dL — ABNORMAL LOW (ref 12.0–15.0)
MCH: 24.8 pg — ABNORMAL LOW (ref 26.0–34.0)
MCHC: 30.3 g/dL (ref 30.0–36.0)
MCV: 82 fL (ref 80.0–100.0)
Platelets: 299 10*3/uL (ref 150–400)
RBC: 4.67 MIL/uL (ref 3.87–5.11)
RDW: 15.2 % (ref 11.5–15.5)
WBC: 5.9 10*3/uL (ref 4.0–10.5)
nRBC: 0 % (ref 0.0–0.2)

## 2023-08-28 LAB — SURGICAL PCR SCREEN
MRSA, PCR: NEGATIVE
Staphylococcus aureus: POSITIVE — AB

## 2023-08-28 NOTE — Progress Notes (Signed)
PCP - Dr. Darrow Bussing Cardiologist - Dr. Riley Lam - Last office June 2023. Next appointment is February 2025. Instructed pt to make MD aware of upcoming surgery. She denies any cardiovascular changes since last visit.  PPM/ICD - Denies Device Orders - n/a Rep Notified - n/a  Chest x-ray - n/a EKG - 08/28/2023 Stress Test - 02/20/2018 ECHO - 01/03/2022 Cardiac Cath - Denies  Sleep Study - +OSA years ago, but stopped wearing her CPAP 6 years ago. She is pending another sleep study after this surgery CPAP - n/a  No DM  Last dose of GLP1 agonist- n/a   GLP1 instructions: n/a  Blood Thinner Instructions: n/a Aspirin Instructions: Pt instructed to call surgeon for ASA instructions. Pt states she doesn't take it regularly and will not take anymore prior to surgery. Last dose was 10/14.  NPO after midnight  COVID TEST- n/a   Anesthesia review: Yes. Abnormal EKG. Pt sees cardiology regularly for HTN and CAD.   Patient denies shortness of breath, fever, cough and chest pain at PAT appointment. Pt denies any respiratory illness/infection in the last two months.    All instructions explained to the patient, with a verbal understanding of the material. Patient agrees to go over the instructions while at home for a better understanding. Patient also instructed to self quarantine after being tested for COVID-19. The opportunity to ask questions was provided.

## 2023-08-29 NOTE — Progress Notes (Signed)
Anesthesia Chart Review:  62 year old female with pertinent history of chronic fatigue syndrome, hypothyroid, HTN, OSA not on CPAP, asthma, anxiety, fibromyalgia.  She has had multiple previous negative evaluations for chest pain.  Negative stress test in 2017 and 2019.  Most recently she was evaluated by Dr. Raynelle Jan in February 2023.  Echo and coronary CTA were ordered at that time due to report of DOE and chest discomfort.  Echo showed EF 60-65%, normal wall motion, no significant valvular abnormalities.  CTA showed mild CAC without obstruction.  Her symptoms were felt to be not cardiac in nature.  Preop labs reviewed, mild hypokalemia potassium 3.2, mild anemia with hemoglobin 11.6, otherwise unremarkable.  EKG 08/28/2023: NSR.  Rate 73.  Low-voltage QRS.  Septal infarct, age-indeterminate.  Coronary CTA 01/31/2022: IMPRESSION: 1. Calcium score 14 isolated to LAD this is 8 th percentile for age / sex   2.  CAD RADS 1 non obstructive CAD in LAD see description above   3.  Normal ascending thoracic aorta 2.9 cm  TTE 01/03/2022:  1. Left ventricular ejection fraction, by estimation, is 60 to 65%. The  left ventricle has normal function. The left ventricle has no regional  wall motion abnormalities. There is mild left ventricular hypertrophy.  Left ventricular diastolic parameters  were normal.   2. Right ventricular systolic function is normal. The right ventricular  size is normal.   3. The mitral valve is normal in structure. Trivial mitral valve  regurgitation. No evidence of mitral stenosis.   4. The aortic valve is tricuspid. Aortic valve regurgitation is not  visualized. No aortic stenosis is present.   5. The inferior vena cava is normal in size with greater than 50%  respiratory variability, suggesting right atrial pressure of 3 mmHg.     Zannie Cove [facility]/Anesthesiology Phone [phone] 08/29/2023 3:42 PM

## 2023-08-29 NOTE — Anesthesia Preprocedure Evaluation (Addendum)
Anesthesia Evaluation  Patient identified by MRN, date of birth, ID band Patient awake    Reviewed: Allergy & Precautions, H&P , NPO status , Patient's Chart, lab work & pertinent test results  Airway Mallampati: II  TM Distance: >3 FB Neck ROM: Full    Dental no notable dental hx.    Pulmonary asthma , sleep apnea    Pulmonary exam normal breath sounds clear to auscultation       Cardiovascular hypertension, Pt. on medications + CAD  Normal cardiovascular exam Rhythm:Regular Rate:Normal     Neuro/Psych   Anxiety     negative neurological ROS  negative psych ROS   GI/Hepatic negative GI ROS, Neg liver ROS,,,  Endo/Other  Hypothyroidism    Renal/GU negative Renal ROS  negative genitourinary   Musculoskeletal  (+) Arthritis , Osteoarthritis,  Fibromyalgia -  Abdominal  (+) + obese  Peds negative pediatric ROS (+)  Hematology  (+) Blood dyscrasia, anemia   Anesthesia Other Findings   Reproductive/Obstetrics negative OB ROS                             Anesthesia Physical Anesthesia Plan  ASA: 3  Anesthesia Plan: General   Post-op Pain Management:    Induction: Intravenous  PONV Risk Score and Plan: 3 and Ondansetron, Dexamethasone, Midazolam, Treatment may vary due to age or medical condition and Scopolamine patch - Pre-op  Airway Management Planned: Oral ETT  Additional Equipment:   Intra-op Plan:   Post-operative Plan: Extubation in OR  Informed Consent: I have reviewed the patients History and Physical, chart, labs and discussed the procedure including the risks, benefits and alternatives for the proposed anesthesia with the patient or authorized representative who has indicated his/her understanding and acceptance.     Dental advisory given  Plan Discussed with: CRNA  Anesthesia Plan Comments: (PAT note by Antionette Poles, PA-C: 62 year old female with pertinent history  of chronic fatigue syndrome, hypothyroid, HTN, OSA not on CPAP, asthma, anxiety, fibromyalgia.  She has had multiple previous negative evaluations for chest pain.  Negative stress test in 2017 and 2019.  Most recently she was evaluated by Dr. Raynelle Jan in February 2023.  Echo and coronary CTA were ordered at that time due to report of DOE and chest discomfort.  Echo showed EF 60-65%, normal wall motion, no significant valvular abnormalities.  CTA showed mild CAC without obstruction.  Her symptoms were felt to be not cardiac in nature.  Preop labs reviewed, mild hypokalemia potassium 3.2, mild anemia with hemoglobin 11.6, otherwise unremarkable.  EKG 08/28/2023: NSR.  Rate 73.  Low-voltage QRS.  Septal infarct, age-indeterminate.  Coronary CTA 01/31/2022: IMPRESSION: 1. Calcium score 14 isolated to LAD this is 80 th percentile for age / sex  2.  CAD RADS 1 non obstructive CAD in LAD see description above  3.  Normal ascending thoracic aorta 2.9 cm  TTE 01/03/2022: 1. Left ventricular ejection fraction, by estimation, is 60 to 65%. The  left ventricle has normal function. The left ventricle has no regional  wall motion abnormalities. There is mild left ventricular hypertrophy.  Left ventricular diastolic parameters  were normal.  2. Right ventricular systolic function is normal. The right ventricular  size is normal.  3. The mitral valve is normal in structure. Trivial mitral valve  regurgitation. No evidence of mitral stenosis.  4. The aortic valve is tricuspid. Aortic valve regurgitation is not  visualized. No aortic stenosis is present.  5. The inferior vena cava is normal in size with greater than 50%  respiratory variability, suggesting right atrial pressure of 3 mmHg.   )        Anesthesia Quick Evaluation

## 2023-09-03 ENCOUNTER — Inpatient Hospital Stay (HOSPITAL_COMMUNITY): Payer: Medicare HMO | Admitting: Physician Assistant

## 2023-09-03 ENCOUNTER — Inpatient Hospital Stay (HOSPITAL_COMMUNITY): Payer: Medicare HMO

## 2023-09-03 ENCOUNTER — Encounter (HOSPITAL_COMMUNITY): Payer: Self-pay | Admitting: Neurosurgery

## 2023-09-03 ENCOUNTER — Inpatient Hospital Stay (HOSPITAL_COMMUNITY)
Admission: RE | Disposition: A | Discharge status: Home / Self Care | Payer: Self-pay | Source: Home / Self Care | Attending: Neurosurgery

## 2023-09-03 ENCOUNTER — Other Ambulatory Visit: Payer: Self-pay

## 2023-09-03 ENCOUNTER — Inpatient Hospital Stay (HOSPITAL_COMMUNITY)
Admission: RE | Admit: 2023-09-03 | Discharge: 2023-09-04 | DRG: 428 | Disposition: A | Discharge status: Home / Self Care | Payer: Medicare HMO | Attending: Neurosurgery | Admitting: Neurosurgery

## 2023-09-03 ENCOUNTER — Inpatient Hospital Stay (HOSPITAL_COMMUNITY): Payer: Self-pay

## 2023-09-03 DIAGNOSIS — Z818 Family history of other mental and behavioral disorders: Secondary | ICD-10-CM | POA: Diagnosis not present

## 2023-09-03 DIAGNOSIS — Z87442 Personal history of urinary calculi: Secondary | ICD-10-CM

## 2023-09-03 DIAGNOSIS — M4316 Spondylolisthesis, lumbar region: Secondary | ICD-10-CM

## 2023-09-03 DIAGNOSIS — E785 Hyperlipidemia, unspecified: Secondary | ICD-10-CM | POA: Diagnosis present

## 2023-09-03 DIAGNOSIS — Z8249 Family history of ischemic heart disease and other diseases of the circulatory system: Secondary | ICD-10-CM | POA: Diagnosis not present

## 2023-09-03 DIAGNOSIS — M4726 Other spondylosis with radiculopathy, lumbar region: Secondary | ICD-10-CM | POA: Diagnosis not present

## 2023-09-03 DIAGNOSIS — M797 Fibromyalgia: Secondary | ICD-10-CM | POA: Diagnosis not present

## 2023-09-03 DIAGNOSIS — Z83438 Family history of other disorder of lipoprotein metabolism and other lipidemia: Secondary | ICD-10-CM

## 2023-09-03 DIAGNOSIS — Z79890 Hormone replacement therapy (postmenopausal): Secondary | ICD-10-CM

## 2023-09-03 DIAGNOSIS — M51369 Other intervertebral disc degeneration, lumbar region without mention of lumbar back pain or lower extremity pain: Secondary | ICD-10-CM | POA: Diagnosis not present

## 2023-09-03 DIAGNOSIS — M4310 Spondylolisthesis, site unspecified: Secondary | ICD-10-CM | POA: Diagnosis not present

## 2023-09-03 DIAGNOSIS — G9332 Myalgic encephalomyelitis/chronic fatigue syndrome: Secondary | ICD-10-CM | POA: Diagnosis not present

## 2023-09-03 DIAGNOSIS — M549 Dorsalgia, unspecified: Secondary | ICD-10-CM | POA: Diagnosis not present

## 2023-09-03 DIAGNOSIS — J45909 Unspecified asthma, uncomplicated: Secondary | ICD-10-CM | POA: Diagnosis present

## 2023-09-03 DIAGNOSIS — G4730 Sleep apnea, unspecified: Secondary | ICD-10-CM | POA: Diagnosis present

## 2023-09-03 DIAGNOSIS — I2510 Atherosclerotic heart disease of native coronary artery without angina pectoris: Secondary | ICD-10-CM | POA: Diagnosis not present

## 2023-09-03 DIAGNOSIS — Z881 Allergy status to other antibiotic agents status: Secondary | ICD-10-CM

## 2023-09-03 DIAGNOSIS — Z885 Allergy status to narcotic agent status: Secondary | ICD-10-CM

## 2023-09-03 DIAGNOSIS — Z981 Arthrodesis status: Secondary | ICD-10-CM | POA: Diagnosis not present

## 2023-09-03 DIAGNOSIS — E063 Autoimmune thyroiditis: Secondary | ICD-10-CM | POA: Diagnosis present

## 2023-09-03 DIAGNOSIS — I10 Essential (primary) hypertension: Secondary | ICD-10-CM | POA: Diagnosis present

## 2023-09-03 DIAGNOSIS — Z79899 Other long term (current) drug therapy: Secondary | ICD-10-CM

## 2023-09-03 DIAGNOSIS — F419 Anxiety disorder, unspecified: Secondary | ICD-10-CM | POA: Diagnosis not present

## 2023-09-03 DIAGNOSIS — I700 Atherosclerosis of aorta: Secondary | ICD-10-CM | POA: Diagnosis not present

## 2023-09-03 DIAGNOSIS — Z90710 Acquired absence of both cervix and uterus: Secondary | ICD-10-CM

## 2023-09-03 DIAGNOSIS — M532X6 Spinal instabilities, lumbar region: Secondary | ICD-10-CM | POA: Diagnosis not present

## 2023-09-03 DIAGNOSIS — Z882 Allergy status to sulfonamides status: Secondary | ICD-10-CM

## 2023-09-03 DIAGNOSIS — Z888 Allergy status to other drugs, medicaments and biological substances status: Secondary | ICD-10-CM | POA: Diagnosis not present

## 2023-09-03 DIAGNOSIS — M48061 Spinal stenosis, lumbar region without neurogenic claudication: Principal | ICD-10-CM | POA: Diagnosis present

## 2023-09-03 DIAGNOSIS — H6993 Unspecified Eustachian tube disorder, bilateral: Secondary | ICD-10-CM | POA: Diagnosis not present

## 2023-09-03 DIAGNOSIS — M1990 Unspecified osteoarthritis, unspecified site: Secondary | ICD-10-CM | POA: Diagnosis not present

## 2023-09-03 LAB — ABO/RH: ABO/RH(D): A POS

## 2023-09-03 SURGERY — POSTERIOR LUMBAR FUSION 3 LEVEL
Anesthesia: General | Site: Back

## 2023-09-03 MED ORDER — CHLORHEXIDINE GLUCONATE CLOTH 2 % EX PADS: 6.0000 | MEDICATED_PAD | Freq: Once | CUTANEOUS | Status: DC

## 2023-09-03 MED ORDER — AMISULPRIDE (ANTIEMETIC) 5 MG/2ML IV SOLN: 10.0000 mg | Freq: Once | INTRAVENOUS | Status: DC | PRN

## 2023-09-03 MED ORDER — CHLORHEXIDINE GLUCONATE 0.12 % MT SOLN: 15.0000 mL | Freq: Once | OROMUCOSAL | Status: AC

## 2023-09-03 MED ORDER — ACETAMINOPHEN 325 MG PO TABS
650.0000 mg | ORAL_TABLET | ORAL | Status: DC | PRN
  Administered 2023-09-04: 650 mg via ORAL
  Filled 2023-09-03: qty 2

## 2023-09-03 MED ORDER — TRANEXAMIC ACID-NACL 1000-0.7 MG/100ML-% IV SOLN
INTRAVENOUS | Status: AC
  Filled 2023-09-03: qty 100

## 2023-09-03 MED ORDER — HYDROMORPHONE HCL 1 MG/ML IJ SOLN: 1.0000 mg | INTRAMUSCULAR | Status: DC | PRN

## 2023-09-03 MED ORDER — HYDROCODONE-ACETAMINOPHEN 10-325 MG PO TABS: 1.0000 | ORAL_TABLET | ORAL | Status: DC | PRN

## 2023-09-03 MED ORDER — BISACODYL 10 MG RE SUPP: 10.0000 mg | Freq: Every day | RECTAL | Status: DC | PRN

## 2023-09-03 MED ORDER — CHLORHEXIDINE GLUCONATE 0.12 % MT SOLN
OROMUCOSAL | Status: AC
  Administered 2023-09-03: 15 mL via OROMUCOSAL
  Filled 2023-09-03: qty 15

## 2023-09-03 MED ORDER — SENNA 8.6 MG PO TABS
1.0000 | ORAL_TABLET | Freq: Two times a day (BID) | ORAL | Status: DC
  Administered 2023-09-03 – 2023-09-04 (×2): 8.6 mg via ORAL
  Filled 2023-09-03: qty 1

## 2023-09-03 MED ORDER — ALPRAZOLAM 0.5 MG PO TABS: 0.5000 mg | ORAL_TABLET | Freq: Two times a day (BID) | ORAL | Status: DC | PRN

## 2023-09-03 MED ORDER — ALBUTEROL SULFATE (2.5 MG/3ML) 0.083% IN NEBU: 3.0000 mL | INHALATION_SOLUTION | Freq: Four times a day (QID) | RESPIRATORY_TRACT | Status: DC | PRN

## 2023-09-03 MED ORDER — CEFAZOLIN SODIUM-DEXTROSE 2-4 GM/100ML-% IV SOLN
INTRAVENOUS | Status: AC
  Filled 2023-09-03: qty 100

## 2023-09-03 MED ORDER — THROMBIN 20000 UNITS EX SOLR
CUTANEOUS | Status: AC
  Filled 2023-09-03: qty 20000

## 2023-09-03 MED ORDER — CYANOCOBALAMIN 1000 MCG/ML IJ SOLN
1000.0000 ug | INTRAMUSCULAR | Status: DC
  Filled 2023-09-03: qty 1

## 2023-09-03 MED ORDER — DEXAMETHASONE SODIUM PHOSPHATE 10 MG/ML IJ SOLN
INTRAMUSCULAR | Status: AC
  Filled 2023-09-03: qty 1

## 2023-09-03 MED ORDER — 0.9 % SODIUM CHLORIDE (POUR BTL) OPTIME
TOPICAL | Status: DC | PRN
  Administered 2023-09-03: 1000 mL

## 2023-09-03 MED ORDER — FENTANYL CITRATE (PF) 250 MCG/5ML IJ SOLN
INTRAMUSCULAR | Status: DC | PRN
  Administered 2023-09-03 (×3): 50 ug via INTRAVENOUS
  Administered 2023-09-03: 100 ug via INTRAVENOUS

## 2023-09-03 MED ORDER — BACLOFEN 5 MG HALF TABLET: 5.0000 mg | ORAL_TABLET | Freq: Three times a day (TID) | ORAL | Status: DC | PRN

## 2023-09-03 MED ORDER — LACTATED RINGERS IV SOLN: INTRAVENOUS | Status: DC

## 2023-09-03 MED ORDER — MENTHOL 3 MG MT LOZG: 1.0000 | LOZENGE | OROMUCOSAL | Status: DC | PRN

## 2023-09-03 MED ORDER — PROMETHAZINE HCL 25 MG/ML IJ SOLN
INTRAMUSCULAR | Status: AC
  Filled 2023-09-03: qty 1

## 2023-09-03 MED ORDER — KETAMINE HCL 10 MG/ML IJ SOLN
INTRAMUSCULAR | Status: DC | PRN
  Administered 2023-09-03 (×3): 10 mg via INTRAVENOUS
  Administered 2023-09-03: 20 mg via INTRAVENOUS

## 2023-09-03 MED ORDER — MEPERIDINE HCL 25 MG/ML IJ SOLN: 6.2500 mg | INTRAMUSCULAR | Status: DC | PRN

## 2023-09-03 MED ORDER — FLEET ENEMA RE ENEM: 1.0000 | ENEMA | Freq: Once | RECTAL | Status: DC | PRN

## 2023-09-03 MED ORDER — SODIUM CHLORIDE 0.9% FLUSH
3.0000 mL | Freq: Two times a day (BID) | INTRAVENOUS | Status: DC
  Administered 2023-09-03: 3 mL via INTRAVENOUS

## 2023-09-03 MED ORDER — OXYCODONE HCL 5 MG/5ML PO SOLN: 5.0000 mg | Freq: Once | ORAL | Status: DC | PRN

## 2023-09-03 MED ORDER — SCOPOLAMINE 1 MG/3DAYS TD PT72
1.0000 | MEDICATED_PATCH | Freq: Once | TRANSDERMAL | Status: DC
  Administered 2023-09-03: 1.5 mg via TRANSDERMAL
  Filled 2023-09-03: qty 1

## 2023-09-03 MED ORDER — HYDROMORPHONE HCL 1 MG/ML IJ SOLN
0.2500 mg | INTRAMUSCULAR | Status: DC | PRN
Start: 2023-09-03 — End: 2023-09-03

## 2023-09-03 MED ORDER — PHENOL 1.4 % MT LIQD: 1.0000 | OROMUCOSAL | Status: DC | PRN

## 2023-09-03 MED ORDER — TRANEXAMIC ACID-NACL 1000-0.7 MG/100ML-% IV SOLN
INTRAVENOUS | Status: DC | PRN
  Administered 2023-09-03: 1000 mg via INTRAVENOUS

## 2023-09-03 MED ORDER — SODIUM CHLORIDE 0.9 % IV SOLN: 12.5000 mg | INTRAVENOUS | Status: DC | PRN

## 2023-09-03 MED ORDER — LIDOCAINE 2% (20 MG/ML) 5 ML SYRINGE
INTRAMUSCULAR | Status: DC | PRN
  Administered 2023-09-03: 80 mg via INTRAVENOUS

## 2023-09-03 MED ORDER — OXYCODONE HCL 5 MG PO TABS: 5.0000 mg | ORAL_TABLET | Freq: Once | ORAL | Status: DC | PRN

## 2023-09-03 MED ORDER — FENTANYL CITRATE (PF) 250 MCG/5ML IJ SOLN
INTRAMUSCULAR | Status: AC
  Filled 2023-09-03: qty 5

## 2023-09-03 MED ORDER — EZETIMIBE 10 MG PO TABS: 10.0000 mg | ORAL_TABLET | Freq: Every day | ORAL | Status: DC

## 2023-09-03 MED ORDER — POLYETHYLENE GLYCOL 3350 17 G PO PACK: 17.0000 g | PACK | Freq: Every day | ORAL | Status: DC | PRN

## 2023-09-03 MED ORDER — LORATADINE 10 MG PO TABS: 10.0000 mg | ORAL_TABLET | Freq: Every day | ORAL | Status: DC

## 2023-09-03 MED ORDER — ACETAMINOPHEN 650 MG RE SUPP
650.0000 mg | RECTAL | Status: DC | PRN
Start: 2023-09-03 — End: 2023-09-04

## 2023-09-03 MED ORDER — SUGAMMADEX SODIUM 200 MG/2ML IV SOLN
INTRAVENOUS | Status: DC | PRN
  Administered 2023-09-03: 200 mg via INTRAVENOUS

## 2023-09-03 MED ORDER — GABAPENTIN 300 MG PO CAPS
300.0000 mg | ORAL_CAPSULE | Freq: Three times a day (TID) | ORAL | Status: DC
  Administered 2023-09-03 – 2023-09-04 (×3): 300 mg via ORAL
  Filled 2023-09-03 (×3): qty 1

## 2023-09-03 MED ORDER — ONDANSETRON HCL 4 MG/2ML IJ SOLN
INTRAMUSCULAR | Status: AC
  Filled 2023-09-03: qty 2

## 2023-09-03 MED ORDER — PROPOFOL 10 MG/ML IV BOLUS
INTRAVENOUS | Status: DC | PRN
  Administered 2023-09-03: 120 mg via INTRAVENOUS

## 2023-09-03 MED ORDER — OMEGA-3-ACID ETHYL ESTERS 1 G PO CAPS: 1.0000 g | ORAL_CAPSULE | Freq: Every day | ORAL | Status: DC

## 2023-09-03 MED ORDER — ONDANSETRON HCL 4 MG/2ML IJ SOLN
INTRAMUSCULAR | Status: DC | PRN
  Administered 2023-09-03: 4 mg via INTRAVENOUS

## 2023-09-03 MED ORDER — MAGNESIUM OXIDE -MG SUPPLEMENT 400 (240 MG) MG PO TABS: 200.0000 mg | ORAL_TABLET | Freq: Every day | ORAL | Status: DC | PRN

## 2023-09-03 MED ORDER — BUPIVACAINE HCL (PF) 0.25 % IJ SOLN
INTRAMUSCULAR | Status: DC | PRN
  Administered 2023-09-03: 30 mL

## 2023-09-03 MED ORDER — BUPROPION HCL ER (XL) 150 MG PO TB24
150.0000 mg | ORAL_TABLET | Freq: Every day | ORAL | Status: DC
  Administered 2023-09-04: 150 mg via ORAL
  Filled 2023-09-03: qty 1

## 2023-09-03 MED ORDER — PHENYLEPHRINE HCL-NACL 20-0.9 MG/250ML-% IV SOLN
INTRAVENOUS | Status: DC | PRN
  Administered 2023-09-03: 80 ug via INTRAVENOUS
  Administered 2023-09-03: 50 ug/min via INTRAVENOUS
  Administered 2023-09-03: 80 ug via INTRAVENOUS
  Administered 2023-09-03: 160 ug via INTRAVENOUS
  Administered 2023-09-03: 80 ug via INTRAVENOUS

## 2023-09-03 MED ORDER — DIAZEPAM 5 MG PO TABS
5.0000 mg | ORAL_TABLET | Freq: Four times a day (QID) | ORAL | Status: DC | PRN
  Administered 2023-09-03 – 2023-09-04 (×2): 5 mg via ORAL
  Filled 2023-09-03 (×2): qty 1

## 2023-09-03 MED ORDER — ORAL CARE MOUTH RINSE: 15.0000 mL | Freq: Once | OROMUCOSAL | Status: AC

## 2023-09-03 MED ORDER — SODIUM CHLORIDE 0.9% FLUSH: 3.0000 mL | INTRAVENOUS | Status: DC | PRN

## 2023-09-03 MED ORDER — ONDANSETRON HCL 4 MG/2ML IJ SOLN
4.0000 mg | Freq: Four times a day (QID) | INTRAMUSCULAR | Status: DC | PRN
  Administered 2023-09-03: 4 mg via INTRAVENOUS
  Filled 2023-09-03: qty 2

## 2023-09-03 MED ORDER — ACETAMINOPHEN 10 MG/ML IV SOLN
INTRAVENOUS | Status: AC
  Filled 2023-09-03: qty 100

## 2023-09-03 MED ORDER — ONDANSETRON HCL 4 MG PO TABS: 4.0000 mg | ORAL_TABLET | Freq: Four times a day (QID) | ORAL | Status: DC | PRN

## 2023-09-03 MED ORDER — ALBUMIN HUMAN 5 % IV SOLN: INTRAVENOUS | Status: DC | PRN

## 2023-09-03 MED ORDER — ROCURONIUM BROMIDE 10 MG/ML (PF) SYRINGE
PREFILLED_SYRINGE | INTRAVENOUS | Status: AC
  Filled 2023-09-03: qty 10

## 2023-09-03 MED ORDER — AMLODIPINE BESYLATE 10 MG PO TABS: 10.0000 mg | ORAL_TABLET | Freq: Every day | ORAL | Status: DC

## 2023-09-03 MED ORDER — HYDROCHLOROTHIAZIDE 25 MG PO TABS: 25.0000 mg | ORAL_TABLET | Freq: Every day | ORAL | Status: DC

## 2023-09-03 MED ORDER — BUPIVACAINE HCL (PF) 0.25 % IJ SOLN
INTRAMUSCULAR | Status: AC
  Filled 2023-09-03: qty 30

## 2023-09-03 MED ORDER — OXYCODONE HCL 5 MG PO TABS
10.0000 mg | ORAL_TABLET | ORAL | Status: DC | PRN
  Administered 2023-09-03 – 2023-09-04 (×5): 10 mg via ORAL
  Filled 2023-09-03 (×5): qty 2

## 2023-09-03 MED ORDER — DEXAMETHASONE SODIUM PHOSPHATE 10 MG/ML IJ SOLN
INTRAMUSCULAR | Status: DC | PRN
  Administered 2023-09-03: 10 mg via INTRAVENOUS

## 2023-09-03 MED ORDER — DULOXETINE HCL 30 MG PO CPEP
60.0000 mg | ORAL_CAPSULE | Freq: Every day | ORAL | Status: DC
  Administered 2023-09-04: 60 mg via ORAL
  Filled 2023-09-03: qty 2

## 2023-09-03 MED ORDER — HYDROMORPHONE HCL 1 MG/ML IJ SOLN
INTRAMUSCULAR | Status: AC
  Administered 2023-09-03: 0.5 mg via INTRAVENOUS
  Filled 2023-09-03: qty 1

## 2023-09-03 MED ORDER — PROPOFOL 10 MG/ML IV BOLUS
INTRAVENOUS | Status: AC
  Filled 2023-09-03: qty 20

## 2023-09-03 MED ORDER — MIDAZOLAM HCL 2 MG/2ML IJ SOLN
INTRAMUSCULAR | Status: AC
  Filled 2023-09-03: qty 2

## 2023-09-03 MED ORDER — LEVOTHYROXINE SODIUM 112 MCG PO TABS
112.0000 ug | ORAL_TABLET | Freq: Every day | ORAL | Status: DC
  Administered 2023-09-04: 112 ug via ORAL
  Filled 2023-09-03: qty 1

## 2023-09-03 MED ORDER — ROCURONIUM BROMIDE 10 MG/ML (PF) SYRINGE
PREFILLED_SYRINGE | INTRAVENOUS | Status: DC | PRN
  Administered 2023-09-03 (×2): 20 mg via INTRAVENOUS
  Administered 2023-09-03: 80 mg via INTRAVENOUS

## 2023-09-03 MED ORDER — LOSARTAN POTASSIUM 50 MG PO TABS: 100.0000 mg | ORAL_TABLET | Freq: Every day | ORAL | Status: DC

## 2023-09-03 MED ORDER — THROMBIN 20000 UNITS EX SOLR
CUTANEOUS | Status: DC | PRN
  Administered 2023-09-03 (×2): 20 mL via TOPICAL

## 2023-09-03 MED ORDER — VANCOMYCIN HCL 1000 MG IV SOLR
INTRAVENOUS | Status: DC | PRN
  Administered 2023-09-03: 1000 mg

## 2023-09-03 MED ORDER — KETAMINE HCL 50 MG/5ML IJ SOSY
PREFILLED_SYRINGE | INTRAMUSCULAR | Status: AC
  Filled 2023-09-03: qty 5

## 2023-09-03 MED ORDER — VANCOMYCIN HCL 1000 MG IV SOLR
INTRAVENOUS | Status: AC
  Filled 2023-09-03: qty 20

## 2023-09-03 MED ORDER — CEFAZOLIN SODIUM-DEXTROSE 1-4 GM/50ML-% IV SOLN
1.0000 g | Freq: Three times a day (TID) | INTRAVENOUS | Status: AC
  Administered 2023-09-03 – 2023-09-04 (×2): 1 g via INTRAVENOUS
  Filled 2023-09-03 (×2): qty 50

## 2023-09-03 MED ORDER — CEFAZOLIN SODIUM-DEXTROSE 2-4 GM/100ML-% IV SOLN
2.0000 g | INTRAVENOUS | Status: AC
  Administered 2023-09-03 (×2): 2 g via INTRAVENOUS

## 2023-09-03 MED ORDER — MIDAZOLAM HCL 2 MG/2ML IJ SOLN
INTRAMUSCULAR | Status: DC | PRN
  Administered 2023-09-03: 2 mg via INTRAVENOUS

## 2023-09-03 MED ORDER — ACETAMINOPHEN 10 MG/ML IV SOLN
INTRAVENOUS | Status: DC | PRN
  Administered 2023-09-03: 1000 mg via INTRAVENOUS

## 2023-09-03 SURGICAL SUPPLY — 62 items
ADH SKN CLS APL DERMABOND .7 (GAUZE/BANDAGES/DRESSINGS) ×1
APL SKNCLS STERI-STRIP NONHPOA (GAUZE/BANDAGES/DRESSINGS) ×1
BAG COUNTER SPONGE SURGICOUNT (BAG) ×1 IMPLANT
BAG DECANTER FOR FLEXI CONT (MISCELLANEOUS) ×1 IMPLANT
BAG SPNG CNTER NS LX DISP (BAG) ×1
BENZOIN TINCTURE PRP APPL 2/3 (GAUZE/BANDAGES/DRESSINGS) ×1 IMPLANT
BLADE BONE MILL MEDIUM (MISCELLANEOUS) ×1 IMPLANT
BLADE CLIPPER SURG (BLADE) IMPLANT
BUR MATCHSTICK NEURO 3.0 LAGG (BURR) ×1 IMPLANT
CAGE EXP CATALYFT 9 (Plate) IMPLANT
CANISTER SUCT 3000ML PPV (MISCELLANEOUS) ×1 IMPLANT
CATH FOLEY LATEX FREE 14FR (CATHETERS) ×1
CATH FOLEY LF 14FR (CATHETERS) IMPLANT
CNTNR URN SCR LID CUP LEK RST (MISCELLANEOUS) ×1 IMPLANT
CONT SPEC 4OZ STRL OR WHT (MISCELLANEOUS) ×1
DERMABOND ADVANCED .7 DNX12 (GAUZE/BANDAGES/DRESSINGS) ×1 IMPLANT
DRAPE C-ARM 42X72 X-RAY (DRAPES) ×2 IMPLANT
DRAPE HALF SHEET 40X57 (DRAPES) IMPLANT
DRAPE LAPAROTOMY 100X72X124 (DRAPES) ×1 IMPLANT
DRAPE SURG 17X23 STRL (DRAPES) ×4 IMPLANT
DRSG OPSITE POSTOP 4X6 (GAUZE/BANDAGES/DRESSINGS) ×1 IMPLANT
DRSG OPSITE POSTOP 4X8 (GAUZE/BANDAGES/DRESSINGS) IMPLANT
DURAPREP 26ML APPLICATOR (WOUND CARE) ×1 IMPLANT
ELECT REM PT RETURN 9FT ADLT (ELECTROSURGICAL) ×1
ELECTRODE REM PT RTRN 9FT ADLT (ELECTROSURGICAL) ×1 IMPLANT
EVACUATOR 1/8 PVC DRAIN (DRAIN) IMPLANT
GAUZE 4X4 16PLY ~~LOC~~+RFID DBL (SPONGE) IMPLANT
GAUZE SPONGE 4X4 12PLY STRL (GAUZE/BANDAGES/DRESSINGS) IMPLANT
GLOVE BIO SURGEON STRL SZ 6.5 (GLOVE) ×1 IMPLANT
GLOVE BIOGEL PI IND STRL 6.5 (GLOVE) ×1 IMPLANT
GLOVE ECLIPSE 9.0 STRL (GLOVE) ×2 IMPLANT
GOWN STRL REUS W/ TWL LRG LVL3 (GOWN DISPOSABLE) IMPLANT
GOWN STRL REUS W/ TWL XL LVL3 (GOWN DISPOSABLE) ×2 IMPLANT
GOWN STRL REUS W/TWL 2XL LVL3 (GOWN DISPOSABLE) IMPLANT
GOWN STRL REUS W/TWL LRG LVL3 (GOWN DISPOSABLE)
GOWN STRL REUS W/TWL XL LVL3 (GOWN DISPOSABLE) ×2
KIT BASIN OR (CUSTOM PROCEDURE TRAY) ×1 IMPLANT
KIT TURNOVER KIT B (KITS) ×1 IMPLANT
MILL BONE PREP (MISCELLANEOUS) IMPLANT
NDL HYPO 22X1.5 SAFETY MO (MISCELLANEOUS) ×1 IMPLANT
NEEDLE HYPO 22X1.5 SAFETY MO (MISCELLANEOUS) ×1 IMPLANT
NS IRRIG 1000ML POUR BTL (IV SOLUTION) ×1 IMPLANT
PACK LAMINECTOMY NEURO (CUSTOM PROCEDURE TRAY) ×1 IMPLANT
PUTTY DBF 6CC CORTICAL FIBERS (Putty) IMPLANT
PUTTY GRAFTON DBF 6CC W/DELIVE (Putty) IMPLANT
ROD MAX 100MM (Rod) IMPLANT
SCREW PA EVEREST 5.5X40 (Screw) IMPLANT
SCREW POLYAXIAL 5.5X45MM (Screw) IMPLANT
SET SCREW (Screw) ×8 IMPLANT
SET SCREW VRST (Screw) IMPLANT
SPACER PL CATALYFT LONG 11 (Spacer) IMPLANT
SPIKE FLUID TRANSFER (MISCELLANEOUS) ×1 IMPLANT
SPONGE SURGIFOAM ABS GEL 100 (HEMOSTASIS) ×1 IMPLANT
STRIP CLOSURE SKIN 1/2X4 (GAUZE/BANDAGES/DRESSINGS) ×2 IMPLANT
SUT VIC AB 0 CT1 18XCR BRD8 (SUTURE) ×2 IMPLANT
SUT VIC AB 0 CT1 8-18 (SUTURE) ×2
SUT VIC AB 2-0 CT1 18 (SUTURE) ×2 IMPLANT
SUT VIC AB 3-0 SH 8-18 (SUTURE) ×2 IMPLANT
TOWEL GREEN STERILE (TOWEL DISPOSABLE) ×1 IMPLANT
TOWEL GREEN STERILE FF (TOWEL DISPOSABLE) ×1 IMPLANT
TRAY FOLEY MTR SLVR 16FR STAT (SET/KITS/TRAYS/PACK) ×1 IMPLANT
WATER STERILE IRR 1000ML POUR (IV SOLUTION) ×1 IMPLANT

## 2023-09-03 NOTE — Transfer of Care (Signed)
Immediate Anesthesia Transfer of Care Note  Patient: Sabrina Snyder  Procedure(s) Performed: Posterior Lumbar Interbody Fusion  - Lumbar two-Lumbar three - Lumbar three-Lumbar four - Lumbar four-Lumbar five - Posterior Lateral and Interbody fusion (Back)  Patient Location: PACU  Anesthesia Type:General  Level of Consciousness: awake and drowsy  Airway & Oxygen Therapy: Patient Spontanous Breathing and Patient connected to face mask oxygen  Post-op Assessment: Report given to RN and Post -op Vital signs reviewed and stable  Post vital signs: Reviewed and stable  Last Vitals:  Vitals Value Taken Time  BP 109/85 09/03/23 1510  Temp    Pulse 108 09/03/23 1513  Resp 18 09/03/23 1513  SpO2 95 % 09/03/23 1513  Vitals shown include unfiled device data.  Last Pain:  Vitals:   09/03/23 0858  TempSrc:   PainSc: 7          Complications:  Encounter Notable Events  Notable Event Outcome Phase Comment  Difficult to intubate - expected  Intraprocedure Filed from anesthesia note documentation.

## 2023-09-03 NOTE — Anesthesia Postprocedure Evaluation (Signed)
Anesthesia Post Note  Patient: Sabrina Snyder  Procedure(s) Performed: Posterior Lumbar Interbody Fusion  - Lumbar two-Lumbar three - Lumbar three-Lumbar four - Lumbar four-Lumbar five - Posterior Lateral and Interbody fusion (Back)     Patient location during evaluation: PACU Anesthesia Type: General Level of consciousness: awake and alert Pain management: pain level controlled Vital Signs Assessment: post-procedure vital signs reviewed and stable Respiratory status: spontaneous breathing, nonlabored ventilation and respiratory function stable Cardiovascular status: blood pressure returned to baseline and stable Postop Assessment: no apparent nausea or vomiting Anesthetic complications: yes   Encounter Notable Events  Notable Event Outcome Phase Comment  Difficult to intubate - expected  Intraprocedure Filed from anesthesia note documentation.    Last Vitals:  Vitals:   09/03/23 0845 09/03/23 1515  BP: 128/81 112/65  Pulse: 88 (!) 106  Resp: 16 18  Temp: 36.8 C 37.3 C  SpO2: 97% 96%    Last Pain:  Vitals:   09/03/23 1530  TempSrc:   PainSc: 7                  Lowella Curb

## 2023-09-03 NOTE — Op Note (Signed)
Date of procedure: 09/03/2023  Date of dictation: Same  Service: Neurosurgery  Preoperative diagnosis: L2-3 degenerative retrolisthesis with severe stenosis, L3-4 lateral recess and foraminal stenosis with instability, L4-5 unstable degenerative spondylolisthesis with stenosis  Postoperative diagnosis: Same  Procedure Name: Bilateral L2-3 and L3-4 and L4-5 decompressive laminotomies and foraminotomies, more than would be required for simple interbody fusion alone.  Bilateral L4-5 ponte osteotomies for restoration of sagittal balance  L2-3, L3-4, L4-5 posterior lumbar interbody fusion utilizing interbody cages, locally harvested autograft, and morselized allograft  L2-3-4 5 posterior lateral arthrodesis utilizing segmental pedicle screw fixation and local autograft.  Surgeon:Aviya Jarvie A.Shirley Bolle, M.D.  Asst. Surgeon: Marland Mcalpine  Anesthesia: General  Indication: 62 year old female with severe back and bilateral lower extremity pain progressive numbness and weakness.  Workup demonstrates evidence of severe disc degeneration with degenerative retrolisthesis and severe stenosis at L2-3.  Patient with lateral recess and foraminal stenosis with marked facet arthropathy and some dynamic instability at L3-4.  Patient also with an unstable degenerative spondylolisthesis with severe foraminal and lateral recess stenosis at L4-5.  Patient has failed conservative management presents now for 3 level posterior lumbar decompression and fusion surgery.  Operative note: After induction of anesthesia, patient positioned prone on the Wilson frame and appropriately padded.  Lumbar region prepped and draped sterilely.  Incision made from L2-L5.  Dissection performed bilaterally.  Retractor placed.  Fluoroscopy used.  Levels confirmed.  Decompressive laminotomies and facetectomy was then performed bilaterally at L2-3, L3-4 and L4-5 to remove the inferior aspect of the lamina above the entire inferior facet and pars  interarticularis of the superior level.  The majority of the superior articular process of the inferior level at all 3 levels was resected as was the superior edge of the lamina of the inferior level.  Ligament flavum elevated and resected.  Bilateral discectomy was then performed at L2-3, L3-4 and L4-5.  Facetectomy was further completed laterally at L4-5 to complete ponte osteotomies so that the segment could be better mobilized and her be reduced more readily.  Preparations were made for interbody fusion.  With distractors placed patient's right side disc base is then cleaned at L2-3 L3-4 and L4-5.  Soft tissue removed the interspace.  Med[employer]onic expandable cage was then impacted into place at L2-3, L3-4 and L4-5.  Each cage was then expanded.  Distractor removed patient's right side.  Disc base is then prepared on the right side.  Morselized autograft packed in the interspace.  The second cage was then impacted in place and all 3 levels.  All cages were expanded providing good reduction of the patient's deformity.  Pedicles of L2-L3-L4 and L5 were then identified using surface landmarks and intraoperative fluoroscopy.  Superficial bone overlying the pedicle was then removed using high-speed drill.  Each pedicle was then probed using a pedicle awl.  Each pedicle tract was then probed and found to be solidly within the bone.  Each pedicle tract was then tapped with a screw tap.  Screw table was probed and confirmed to be solidly within the pedicle and vertebral body at all levels.  5.5 mm Everest screws from [employer] placed bilaterally at L2-L3-L4 and L5.  Final images reveal good position of the cages and the hardware at the proper level with normal alignment of spine.  Transverse processes from L2-L5 were decorticated.  Morselized autograft was packed posterior laterally.  Short segment titanium rod was then contoured and placed through the screw heads from L2-L5.  Locking caps which are the screws.  Locking caps then engaged with the construct under compression.  Each cage was then packed with demineralized bone fibers.  Gelfoam was placed over the laminotomy defects.  Documentation patters placed the deep wound space.  Wounds then closed in layers with Vicryl sutures.  Steri-Strips and sterile dressing were applied.  No apparent complications.  Patient tolerated the procedure well and she returns to the recovery room postop.

## 2023-09-03 NOTE — Anesthesia Procedure Notes (Signed)
Procedure Name: Intubation Date/Time: 09/03/2023 11:13 AM  Performed by: Loleta Arvine Clayburn, CRNAPre-anesthesia Checklist: Patient identified, Emergency Drugs available, Suction available and Patient being monitored Patient Re-evaluated:Patient Re-evaluated prior to induction Oxygen Delivery Method: Circle system utilized Preoxygenation: Pre-oxygenation with 100% oxygen Induction Type: IV induction Ventilation: Mask ventilation without difficulty Laryngoscope Size: Mac and 3 Grade View: Grade II Tube type: Oral Tube size: 7.0 mm Number of attempts: 2 Airway Equipment and Method: Stylet and Oral airway Placement Confirmation: ETT inserted through vocal cords under direct vision, positive ETCO2 and breath sounds checked- equal and bilateral Secured at: 21 cm Tube secured with: Tape Dental Injury: Teeth and Oropharynx as per pre-operative assessment  Difficulty Due To: Difficulty was anticipated and Difficult Airway- due to limited oral opening Comments: Intubated by Angelica Chessman

## 2023-09-03 NOTE — Brief Op Note (Signed)
09/03/2023  2:49 PM  PATIENT:  Sabrina Snyder  62 y.o. female  PRE-OPERATIVE DIAGNOSIS:  Spondylisthesis  POST-OPERATIVE DIAGNOSIS:  Spondylisthesis  PROCEDURE:  Procedure(s): Posterior Lumbar Interbody Fusion  - Lumbar two-Lumbar three - Lumbar three-Lumbar four - Lumbar four-Lumbar five - Posterior Lateral and Interbody fusion (N/A)  SURGEON:  Surgeons and Role:    Julio Sicks, MD - Primary  PHYSICIAN ASSISTANT:   ASSISTANTSMarland Mcalpine   ANESTHESIA:   general  EBL:  300 mL   BLOOD ADMINISTERED:none  DRAINS: none   LOCAL MEDICATIONS USED:  MARCAINE     SPECIMEN:  No Specimen  DISPOSITION OF SPECIMEN:  N/A  COUNTS:  YES  TOURNIQUET:  * No tourniquets in log *  DICTATION: .Dragon Dictation  PLAN OF CARE: Admit to inpatient   PATIENT DISPOSITION:  PACU - hemodynamically stable.   Delay start of Pharmacological VTE agent (>24hrs) due to surgical blood loss or risk of bleeding: yes

## 2023-09-03 NOTE — H&P (Signed)
Sabrina Snyder is an 62 y.o. female.   Chief Complaint: Back pain HPI: 62 year old female with back and bilateral extremity progressive pain numbness and some weakness.  Workup demonstrates evidence of very severe stenosis at L2-3 secondary to broad-based disc herniation marked facet arthropathy.  Patient also with significant disc degeneration with lateral recess and foraminal stenosis at L3-4.  Lastly the patient has evidence of degenerative spondylolisthesis which is unstable at L4-5 with marked lateral recess and foraminal stenosis.  She has some early disc degeneration L1-2 without evidence of stenosis.  The L5-S1 motion segment appears healthy.  Patient has failed conservative management presents now for multilevel lumbar decompression and fusion in hopes of improving her symptoms.  Past Medical History:  Diagnosis Date   Anxiety    Arthritis    Spine   Asthma    CFS (chronic fatigue syndrome)    Coronary artery disease    Fibromyalgia    Hashimoto's disease    History of kidney stones    HTN (hypertension)    Hyperlipidemia    Hypothyroidism (acquired)    IDA (iron deficiency anemia)    Sleep apnea     Past Surgical History:  Procedure Laterality Date   COLONOSCOPY  2024   EAR CYST EXCISION Bilateral    thyroid ablation     TONSILLECTOMY  1975   URETERAL STENT PLACEMENT     VAGINAL HYSTERECTOMY  2013    Family History  Problem Relation Age of Onset   Colon polyps Mother    Atrial fibrillation Mother    Hypertension Mother    Pancreatic cancer Father    Heart disease Father    Hyperlipidemia Father    Diabetes Father    Hypertension Father    Hypertension Sister    Schizophrenia Sister    Hypertension Sister    Colon polyps Brother    Hypertension Brother    Colon cancer Neg Hx    Esophageal cancer Neg Hx    Stomach cancer Neg Hx    Rectal cancer Neg Hx    Social History:  reports that she has never smoked. She has never used smokeless tobacco. She  reports that she does not drink alcohol and does not use drugs.  Allergies:  Allergies  Allergen Reactions   Codeine Nausea And Vomiting   Doxycycline Nausea And Vomiting   Sulfa Antibiotics Other (See Comments)    Pt states the medication her very uncomfortable; break out in sweats; makes her feel bad    Ace Inhibitors Swelling and Other (See Comments)    Angioedema    Nitrofurantoin     GI intolerance    Medications Prior to Admission  Medication Sig Dispense Refill   acetaminophen (TYLENOL) 650 MG CR tablet Take 650 mg by mouth 4 (four) times daily as needed for pain.     albuterol (VENTOLIN HFA) 108 (90 Base) MCG/ACT inhaler Inhale 1-2 puffs into the lungs every 6 (six) hours as needed for wheezing or shortness of breath.     ALPRAZolam (XANAX) 0.5 MG tablet Take 0.5 mg by mouth 2 (two) times daily as needed for anxiety.     amLODipine (NORVASC) 10 MG tablet TAKE 1 TABLET BY MOUTH EVERY DAY 15 tablet 0   aspirin EC 325 MG tablet Take 325 mg by mouth daily as needed for moderate pain.     Baclofen 5 MG TABS Take 1 tablet by mouth 3 (three) times daily as needed (muscle spasms).     buPROPion (  WELLBUTRIN XL) 150 MG 24 hr tablet Take 150 mg by mouth daily.     cyanocobalamin (,VITAMIN B-12,) 1000 MCG/ML injection Inject 1,000 mcg into the skin every 7 (seven) days.  11   DULoxetine (CYMBALTA) 60 MG capsule TAKE 1 CAPSULE ONCE A DAY BY MOUTH DAILY 90 capsule 1   EPINEPHrine 0.3 mg/0.3 mL IJ SOAJ injection Inject 0.3 mg into the muscle as needed for anaphylaxis.     ezetimibe (ZETIA) 10 MG tablet Take 1 tablet (10 mg total) by mouth daily. 90 tablet 3   gabapentin (NEURONTIN) 300 MG capsule Take 300 mg by mouth 3 (three) times daily.     hydrochlorothiazide (HYDRODIURIL) 25 MG tablet TAKE 1 TABLET BY MOUTH EVERY DAY 90 tablet 0   levothyroxine (SYNTHROID) 112 MCG tablet Take 112 mcg by mouth daily before breakfast.     loratadine (CLARITIN) 10 MG tablet Take 10 mg by mouth daily.      losartan (COZAAR) 100 MG tablet Take 100 mg by mouth daily.     MAGNESIUM PO Take 1 tablet by mouth daily as needed (constipation).     Naltrexone HCl, Pain, (NALTREX) 4.5 MG CAPS Take 4.5 mg by mouth at bedtime.     Omega-3 Fatty Acids (FISH OIL PO) Take 1 capsule by mouth daily.     Prasterone, DHEA, (DHEA PO) Take 1 tablet by mouth daily as needed (when taking magnesium).      Results for orders placed or performed during the hospital encounter of 09/03/23 (from the past 48 hour(s))  ABO/Rh     Status: None (Preliminary result)   Collection Time: 09/03/23  8:50 AM  Result Value Ref Range   ABO/RH(D) PENDING    No results found.  Pertinent items noted in HPI and remainder of comprehensive ROS otherwise negative.  Blood pressure 128/81, pulse 88, temperature 98.2 F (36.8 C), temperature source Oral, resp. rate 16, height 5\' 2"  (1.575 m), weight 74.8 kg, SpO2 97%.  Patient is awake and alert.  She is oriented and appropriate.  Speech is fluent.  Judgment insight are intact.  Cranial nerve function normal bilateral.  Motor examination reveals intact motor strength bilaterally sensory examination with decrease sensation pinprick and light touch from L3 distally.  Deep tendon rhexis hypoactive but symmetric.  No evidence of long track signs.  Gait antalgic.  Posture mildly flexed peer examination head ears eyes nose and throat is unremarkable chest and abdomen are benign.  Extremities are free from injury or deformity. Assessment/Plan L2-3 stenosis with severe facet arthropathy, L3-4 degenerative disc disease with spondylosis and foraminal stenosis and lateral recess stenosis, L4-5 unstable degenerative spondylolisthesis with stenosis.  Plan L2-3, L3-4, L4-5 decompressive laminotomies and foraminotomies followed by posterior lumbar MRI fusion lysing interbody cages, local harvested autograft, and augmented with posterior arthrodesis utilizing nonsegmental pedicle screw fixation and local  autografting.  Risks and benefits been explained.  Patient wishes to proceed.  Kathaleen Maser Chayson Charters 09/03/2023, 9:44 AM

## 2023-09-04 ENCOUNTER — Other Ambulatory Visit: Payer: Self-pay

## 2023-09-04 MED ORDER — METHOCARBAMOL 500 MG PO TABS: 500.0000 mg | ORAL_TABLET | Freq: Four times a day (QID) | ORAL | 1 refills | Status: DC | PRN

## 2023-09-04 MED ORDER — ONDANSETRON HCL 4 MG PO TABS: 4.0000 mg | ORAL_TABLET | Freq: Four times a day (QID) | ORAL | 0 refills | Status: DC | PRN

## 2023-09-04 MED ORDER — OXYCODONE HCL 10 MG PO TABS: 10.0000 mg | ORAL_TABLET | ORAL | 0 refills | Status: DC | PRN

## 2023-09-04 MED FILL — Thrombin For Soln 20000 Unit: CUTANEOUS | Qty: 1 | Status: AC

## 2023-09-04 NOTE — Discharge Summary (Signed)
Physician Discharge Summary  Patient ID: Sabrina Snyder MRN: [id number] DOB/AGE: 1961/09/22 62 y.o.  Admit date: 09/03/2023 Discharge date: 09/04/2023  Admission Diagnoses:  Discharge Diagnoses:  Principal Problem:   Degenerative spondylolisthesis   Discharged Condition: good  Hospital Course: Patient admitted to the hospital where she underwent uncomplicated three-level lumbar decompression and fusion.  Postoperatively doing well.  Preoperative back and radicular symptoms much improved.  Standing ambulating and voiding without difficulty.  Ready for discharge home.  Consults:   Significant Diagnostic Studies:   Treatments:   Discharge Exam: Blood pressure 104/68, pulse 90, temperature 98 F (36.7 C), resp. rate 20, height 5\' 2"  (1.575 m), weight 74.8 kg, SpO2 96%. Awake and alert.  Oriented and appropriate.  Motor and sensory function intact.  Wound clean and dry.  Chest and abdomen benign.  Disposition: Discharge disposition: 01-Home or Self Care        Allergies as of 09/04/2023       Reactions   Codeine Nausea And Vomiting   Doxycycline Nausea And Vomiting   Sulfa Antibiotics Other (See Comments)   Pt states the medication her very uncomfortable; break out in sweats; makes her feel bad   Ace Inhibitors Swelling, Other (See Comments)   Angioedema    Nitrofurantoin    GI intolerance        Medication List     TAKE these medications    acetaminophen 650 MG CR tablet Commonly known as: TYLENOL Take 650 mg by mouth 4 (four) times daily as needed for pain.   albuterol 108 (90 Base) MCG/ACT inhaler Commonly known as: VENTOLIN HFA Inhale 1-2 puffs into the lungs every 6 (six) hours as needed for wheezing or shortness of breath.   ALPRAZolam 0.5 MG tablet Commonly known as: XANAX Take 0.5 mg by mouth 2 (two) times daily as needed for anxiety.   amLODipine 10 MG tablet Commonly known as: NORVASC TAKE 1 TABLET BY MOUTH EVERY DAY   aspirin EC 325 MG  tablet Take 325 mg by mouth daily as needed for moderate pain.   Baclofen 5 MG Tabs Take 1 tablet by mouth 3 (three) times daily as needed (muscle spasms).   buPROPion 150 MG 24 hr tablet Commonly known as: WELLBUTRIN XL Take 150 mg by mouth daily.   cyanocobalamin 1000 MCG/ML injection Commonly known as: VITAMIN B12 Inject 1,000 mcg into the skin every 7 (seven) days.   DHEA PO Take 1 tablet by mouth daily as needed (when taking magnesium).   DULoxetine 60 MG capsule Commonly known as: CYMBALTA TAKE 1 CAPSULE ONCE A DAY BY MOUTH DAILY   EPINEPHrine 0.3 mg/0.3 mL Soaj injection Commonly known as: EPI-PEN Inject 0.3 mg into the muscle as needed for anaphylaxis.   ezetimibe 10 MG tablet Commonly known as: ZETIA Take 1 tablet (10 mg total) by mouth daily.   FISH OIL PO Take 1 capsule by mouth daily.   gabapentin 300 MG capsule Commonly known as: NEURONTIN Take 300 mg by mouth 3 (three) times daily.   hydrochlorothiazide 25 MG tablet Commonly known as: HYDRODIURIL TAKE 1 TABLET BY MOUTH EVERY DAY   levothyroxine 112 MCG tablet Commonly known as: SYNTHROID Take 112 mcg by mouth daily before breakfast.   loratadine 10 MG tablet Commonly known as: CLARITIN Take 10 mg by mouth daily.   losartan 100 MG tablet Commonly known as: COZAAR Take 100 mg by mouth daily.   MAGNESIUM PO Take 1 tablet by mouth daily as needed (constipation).  methocarbamol 500 MG tablet Commonly known as: ROBAXIN Take 1 tablet (500 mg total) by mouth every 6 (six) hours as needed for muscle spasms.   Naltrex 4.5 MG Caps Generic drug: Naltrexone HCl (Pain) Take 4.5 mg by mouth at bedtime.   ondansetron 4 MG tablet Commonly known as: ZOFRAN Take 1 tablet (4 mg total) by mouth every 6 (six) hours as needed for nausea or vomiting.   Oxycodone HCl 10 MG Tabs Take 1 tablet (10 mg total) by mouth every 3 (three) hours as needed for severe pain (pain score 7-10) ((score 7 to 10)).                Durable Medical Equipment  (From admission, onward)           Start     Ordered   09/03/23 1656  DME Walker rolling  Once       Question:  Patient needs a walker to treat with the following condition  Answer:  Degenerative spondylolisthesis   09/03/23 1655   09/03/23 1656  DME 3 n 1  Once        09/03/23 1655             Signed: Sherilyn Cooter A Mysty Kielty 09/04/2023, 11:02 AM

## 2023-09-04 NOTE — Discharge Instructions (Signed)

## 2023-09-04 NOTE — Evaluation (Signed)
Physical Therapy Evaluation Patient Details Name: Sabrina Snyder MRN: [id number] DOB: 1961/06/01 Today's Date: 09/04/2023  History of Present Illness  Pt is a 62 y/o female who presents s/p L2-L5 PLIF on 09/03/23. PMH significant for anxiety, arthritis, asthma, CFS, CAD, fibromyalgia, hashimotos disease, HTN, hypothyroidism, IDA, sleep apnea.   Clinical Impression  Pt admitted with above diagnosis. At the time of PT eval, pt was able to demonstrate transfers and ambulation with gross CGA to min assist and RW for support. Pt was educated on precautions, brace application/wearing schedule, appropriate activity progression, and car transfer. Pt currently with functional limitations due to the deficits listed below (see PT Problem List). Pt will benefit from skilled PT to increase their independence and safety with mobility to allow discharge to the venue listed below.          If plan is discharge home, recommend the following: A little help with bathing/dressing/bathroom;Assistance with cooking/housework;Help with stairs or ramp for entrance;Assist for transportation   Can travel by private vehicle        Equipment Recommendations Rolling walker (2 wheels) (Youth)  Recommendations for Other Services       Functional Status Assessment Patient has had a recent decline in their functional status and demonstrates the ability to make significant improvements in function in a reasonable and predictable amount of time.     Precautions / Restrictions Precautions Precautions: Fall;Back Precaution Booklet Issued: Yes (comment) Required Braces or Orthoses: Spinal Brace Spinal Brace: Lumbar corset Restrictions Weight Bearing Restrictions: No      Mobility  Bed Mobility Overal bed mobility: Needs Assistance Bed Mobility: Rolling, Sidelying to Sit, Sit to Sidelying Rolling: Contact guard assist Sidelying to sit: Contact guard assist     Sit to sidelying: Supervision General bed  mobility comments: VC's and hands on guarding for optimal log roll technique. HOB flat and rails lowered to simulate home environment.    Transfers Overall transfer level: Needs assistance Equipment used: Rolling walker (2 wheels) Transfers: Sit to/from Stand Sit to Stand: Contact guard assist           General transfer comment: VC's for wide BOS, hand placement on seated surface for safety for sit<>stand.    Ambulation/Gait Ambulation/Gait assistance: Contact guard assist Gait Distance (Feet): 200 Feet Assistive device: Rolling walker (2 wheels) Gait Pattern/deviations: Step-through pattern, Decreased stride length, Narrow base of support, Trunk flexed Gait velocity: Decreased Gait velocity interpretation: 1.31 - 2.62 ft/sec, indicative of limited community ambulator   General Gait Details: VC's for improved posture, closer walker proximity and forward gaze. No overt LOB noted but hands on guarding provided throughout for safety.  Stairs Stairs: Yes Stairs assistance: Min assist Stair Management: No rails, Step to pattern, Forwards (HHA on the R) Number of Stairs: 3 General stair comments: VC's for sequencing and general safety.  Wheelchair Mobility     Tilt Bed    Modified Rankin (Stroke Patients Only)       Balance Overall balance assessment: Needs assistance Sitting-balance support: Feet supported Sitting balance-Leahy Scale: Good     Standing balance support: No upper extremity supported, During functional activity Standing balance-Leahy Scale: Fair                               Pertinent Vitals/Pain Pain Assessment Pain Assessment: Faces Faces Pain Scale: Hurts little more Pain Location: back Pain Descriptors / Indicators: Discomfort Pain Intervention(s): Limited activity within patient's tolerance, Monitored during  session, Repositioned    Home Living Family/patient expects to be discharged to:: Private residence Living Arrangements:  Children Available Help at Discharge: Family;Available 24 hours/day Type of Home: House Home Access: Stairs to enter Entrance Stairs-Rails: Doctor, general practice of Steps: 2   Home Layout: One level Home Equipment: Rollator (4 wheels);Shower seat;Toilet riser;Grab bars - tub/shower;Adaptive equipment Additional Comments: dtr lives with pt    Prior Function Prior Level of Function : Independent/Modified Independent;Driving             Mobility Comments: no AD ADLs Comments: does not work, otherwise indep for all ADL/IADLs     Extremity/Trunk Assessment   Upper Extremity Assessment Upper Extremity Assessment: Generalized weakness    Lower Extremity Assessment Lower Extremity Assessment: Generalized weakness (Mild; consistent with pre-op diagnosis)    Cervical / Trunk Assessment Cervical / Trunk Assessment: Back Surgery  Communication   Communication Communication: No apparent difficulties  Cognition Arousal: Alert Behavior During Therapy: WFL for tasks assessed/performed Overall Cognitive Status: No family/caregiver present to determine baseline cognitive functioning                                          General Comments General comments (skin integrity, edema, etc.): VSS on RA    Exercises     Assessment/Plan    PT Assessment Patient needs continued PT services  PT Problem List Decreased strength;Decreased activity tolerance;Decreased mobility;Decreased balance;Decreased knowledge of use of DME;Decreased safety awareness;Decreased knowledge of precautions;Pain       PT Treatment Interventions DME instruction;Gait training;Stair training;Functional mobility training;Therapeutic activities;Therapeutic exercise;Balance training;Patient/family education    PT Goals (Current goals can be found in the Care Plan section)  Acute Rehab PT Goals Patient Stated Goal: Home today PT Goal Formulation: With patient Time For Goal  Achievement: 09/11/23 Potential to Achieve Goals: Good    Frequency Min 1X/week     Co-evaluation               AM-PAC PT "6 Clicks" Mobility  Outcome Measure Help needed turning from your back to your side while in a flat bed without using bedrails?: A Little Help needed moving from lying on your back to sitting on the side of a flat bed without using bedrails?: A Little Help needed moving to and from a bed to a chair (including a wheelchair)?: A Little Help needed standing up from a chair using your arms (e.g., wheelchair or bedside chair)?: A Little Help needed to walk in hospital room?: A Little Help needed climbing 3-5 steps with a railing? : A Little 6 Click Score: 18    End of Session Equipment Utilized During Treatment: Gait belt;Back brace Activity Tolerance: Patient tolerated treatment well Patient left: in bed Nurse Communication: Mobility status PT Visit Diagnosis: Unsteadiness on feet (R26.81);Pain Pain - part of body:  (back)    Time: 2952-8413 PT Time Calculation (min) (ACUTE ONLY): 26 min   Charges:   PT Evaluation $PT Eval Low Complexity: 1 Low PT Treatments $Gait Training: 8-22 mins PT General Charges $$ ACUTE PT VISIT: 1 Visit         Conni Slipper, PT, DPT Acute Rehabilitation Services Secure Chat Preferred Office: [phone]   Marylynn Pearson 09/04/2023, 9:56 AM

## 2023-09-04 NOTE — Evaluation (Signed)
Occupational Therapy Evaluation Patient Details Name: Sabrina Snyder MRN: [id number] DOB: 03-04-1961 Today's Date: 09/04/2023   History of Present Illness Sabrina Snyder is a 62 yo female who underwent Bilateral L2-3 and L3-4 and L4-5 decompressive laminotomies and foraminotomies 10/21. PMHx: anxiety, arthritis, asthma, CFS, CAD, fibromyalgia, hashimotos disease, HTN, HLD, hypothyroidism, IDA, sleep apnea   Clinical Impression   Sabrina Snyder was evaluated s/p the above spine surgery. She is indep at baseline and her daughter lives with her to assist as needed. Upon evaluation pt was limited by spinal precautions, back pain, decreased activity tolerance and generalized weakness. Overall she needed up to [facility] for transfers and cues fro RW management during functional mobility. Pt also needed CGA for LB ADLs and min A to orient her shirt to don, otherwise her cognition was [facility]. Provided cues and education on spinal precautions and compensatory techniques throughout, handout provided and pt demonstrated good recall during ADLs and mobility. Pt does not require further acute OT services. Recommend d/c home with support of family.         If plan is discharge home, recommend the following: A little help with bathing/dressing/bathroom;Assist for transportation;Assistance with cooking/housework    Functional Status Assessment  Patient has had a recent decline in their functional status and demonstrates the ability to make significant improvements in function in a reasonable and predictable amount of time.  Equipment Recommendations  Other (comment) (RW)       Precautions / Restrictions Precautions Precautions: Fall;Back Precaution Booklet Issued: Yes (comment) Required Braces or Orthoses: Spinal Brace Spinal Brace: Lumbar corset Restrictions Weight Bearing Restrictions: No      Mobility Bed Mobility Overal bed mobility: Needs Assistance Bed Mobility: Rolling, Sidelying to Sit, Sit to  Sidelying Rolling: Supervision Sidelying to sit: Contact guard assist     Sit to sidelying: Supervision General bed mobility comments: cues for log roll    Transfers Overall transfer level: Needs assistance Equipment used: Rolling walker (2 wheels) Transfers: Sit to/from Stand Sit to Stand: Contact guard assist                  Balance Overall balance assessment: Needs assistance Sitting-balance support: Feet supported Sitting balance-Leahy Scale: Good     Standing balance support: No upper extremity supported, During functional activity Standing balance-Leahy Scale: Fair                             ADL either performed or assessed with clinical judgement   ADL Overall ADL's : Needs assistance/impaired Eating/Feeding: Independent   Grooming: Supervision/safety;Standing;Cueing for compensatory techniques   Upper Body Bathing: Set up;Sitting   Lower Body Bathing: Supervison/ safety;Cueing for back precautions;Cueing for compensatory techniques;Sit to/from stand   Upper Body Dressing : Minimal assistance;Sitting Upper Body Dressing Details (indicate cue type and reason): pt had a lot of difficulty orienting t-shirt for arm holes and neck hole Lower Body Dressing: Contact guard assist;Cueing for compensatory techniques;Cueing for back precautions;Sit to/from stand   Toilet Transfer: Supervision/safety;Ambulation;Rolling walker (2 wheels);Regular Toilet   Toileting- Clothing Manipulation and Hygiene: Supervision/safety;Sitting/lateral lean       Functional mobility during ADLs: Contact guard assist;Rolling walker (2 wheels) General ADL Comments: good recall of back precautions but had difficulty with orienting t-shirt - otherwise cognition was seemingly [facility]     Vision Baseline Vision/History: 0 No visual deficits Vision Assessment?: No apparent visual deficits     Perception Perception: Within Functional Limits  Praxis Praxis: [facility]        Pertinent Vitals/Pain Pain Assessment Pain Assessment: Faces Faces Pain Scale: Hurts little more Pain Location: back Pain Descriptors / Indicators: Discomfort Pain Intervention(s): Limited activity within patient's tolerance, Monitored during session     Extremity/Trunk Assessment Upper Extremity Assessment Upper Extremity Assessment: Overall WFL for tasks assessed   Lower Extremity Assessment Lower Extremity Assessment: Defer to PT evaluation   Cervical / Trunk Assessment Cervical / Trunk Assessment: Back Surgery   Communication Communication Communication: No apparent difficulties   Cognition Arousal: Alert Behavior During Therapy: WFL for tasks assessed/performed Overall Cognitive Status: No family/caregiver present to determine baseline cognitive functioning                                 General Comments: good recall of BLT, had significant difficulty orienting t-shirt dress     General Comments  VSS on RA     Home Living Family/patient expects to be discharged to:: Private residence Living Arrangements: Children Available Help at Discharge: Family;Available 24 hours/day Type of Home: House Home Access: Stairs to enter [employer] of Steps: 2 Entrance Stairs-Rails: Right;Left Home Layout: One level     Bathroom Shower/Tub: Producer, television/film/video: Standard (with toilet riser)     Home Equipment: Rollator (4 wheels);Shower seat;Toilet riser;Grab bars - tub/shower;Adaptive equipment Adaptive Equipment: Reacher Additional Comments: dtr lives with pt      Prior Functioning/Environment Prior Level of Function : Independent/Modified Independent;Driving             Mobility Comments: no AD ADLs Comments: does not work, otherwise indep for all ADL/IADLs        OT Problem List: Decreased strength;Decreased range of motion;Decreased activity tolerance;Decreased safety awareness;Decreased knowledge of use of DME or  AE;Decreased knowledge of precautions;Decreased cognition      OT Treatment/Interventions: Self-care/ADL training;Therapeutic exercise;DME and/or AE instruction;Therapeutic activities    OT Goals(Current goals can be found in the care plan section) Acute Rehab OT Goals Patient Stated Goal: home OT Goal Formulation: With patient Time For Goal Achievement: 09/18/23 Potential to Achieve Goals: Good  OT Frequency: Min 1X/week       AM-PAC OT "6 Clicks" Daily Activity     Outcome Measure Help from another person eating meals?: None Help from another person taking care of personal grooming?: A Little Help from another person toileting, which includes using toliet, bedpan, or urinal?: A Little Help from another person bathing (including washing, rinsing, drying)?: A Little Help from another person to put on and taking off regular upper body clothing?: A Little Help from another person to put on and taking off regular lower body clothing?: A Little 6 Click Score: 19   End of Session Equipment Utilized During Treatment: Rolling walker (2 wheels);Back brace Nurse Communication: Mobility status  Activity Tolerance: Patient tolerated treatment well Patient left: in bed;with call bell/phone within reach  OT Visit Diagnosis: Unsteadiness on feet (R26.81);Other abnormalities of gait and mobility (R26.89);Pain                Time: 1610-9604 OT Time Calculation (min): 15 min Charges:  OT General Charges $OT Visit: 1 Visit OT Evaluation $OT Eval Low Complexity: 1 Low  Derenda Mis, OTR/L Acute Rehabilitation Services Office [phone] Secure Chat Communication Preferred   Donia Pounds 09/04/2023, 9:25 AM

## 2023-09-06 MED FILL — Sodium Chloride IV Soln 0.9%: INTRAVENOUS | Qty: 2000 | Status: AC

## 2023-09-06 MED FILL — Heparin Sodium (Porcine) Inj 1000 Unit/ML: INTRAMUSCULAR | Qty: 30 | Status: AC

## 2023-09-29 ENCOUNTER — Other Ambulatory Visit: Payer: Self-pay

## 2023-09-29 ENCOUNTER — Emergency Department (HOSPITAL_COMMUNITY)
Admission: EM | Admit: 2023-09-29 | Discharge: 2023-09-29 | Disposition: A | Discharge status: Home / Self Care | Payer: Medicare HMO | Attending: Emergency Medicine | Admitting: Emergency Medicine

## 2023-09-29 ENCOUNTER — Encounter (HOSPITAL_COMMUNITY): Payer: Self-pay

## 2023-09-29 ENCOUNTER — Emergency Department (HOSPITAL_COMMUNITY): Payer: Medicare HMO

## 2023-09-29 DIAGNOSIS — E860 Dehydration: Secondary | ICD-10-CM | POA: Diagnosis not present

## 2023-09-29 DIAGNOSIS — J45909 Unspecified asthma, uncomplicated: Secondary | ICD-10-CM | POA: Insufficient documentation

## 2023-09-29 DIAGNOSIS — I10 Essential (primary) hypertension: Secondary | ICD-10-CM | POA: Diagnosis not present

## 2023-09-29 DIAGNOSIS — Z90710 Acquired absence of both cervix and uterus: Secondary | ICD-10-CM | POA: Diagnosis not present

## 2023-09-29 DIAGNOSIS — Z87442 Personal history of urinary calculi: Secondary | ICD-10-CM | POA: Insufficient documentation

## 2023-09-29 DIAGNOSIS — Z79899 Other long term (current) drug therapy: Secondary | ICD-10-CM | POA: Diagnosis not present

## 2023-09-29 DIAGNOSIS — A419 Sepsis, unspecified organism: Secondary | ICD-10-CM | POA: Diagnosis not present

## 2023-09-29 DIAGNOSIS — Z20822 Contact with and (suspected) exposure to covid-19: Secondary | ICD-10-CM | POA: Insufficient documentation

## 2023-09-29 DIAGNOSIS — R109 Unspecified abdominal pain: Secondary | ICD-10-CM

## 2023-09-29 DIAGNOSIS — R0602 Shortness of breath: Secondary | ICD-10-CM | POA: Diagnosis not present

## 2023-09-29 DIAGNOSIS — R531 Weakness: Secondary | ICD-10-CM | POA: Diagnosis present

## 2023-09-29 DIAGNOSIS — E039 Hypothyroidism, unspecified: Secondary | ICD-10-CM | POA: Diagnosis not present

## 2023-09-29 DIAGNOSIS — R112 Nausea with vomiting, unspecified: Secondary | ICD-10-CM | POA: Insufficient documentation

## 2023-09-29 DIAGNOSIS — R630 Anorexia: Secondary | ICD-10-CM | POA: Insufficient documentation

## 2023-09-29 DIAGNOSIS — I2510 Atherosclerotic heart disease of native coronary artery without angina pectoris: Secondary | ICD-10-CM | POA: Diagnosis not present

## 2023-09-29 DIAGNOSIS — R1031 Right lower quadrant pain: Secondary | ICD-10-CM | POA: Insufficient documentation

## 2023-09-29 LAB — COMPREHENSIVE METABOLIC PANEL
ALT: 10 U/L (ref 0–44)
AST: 20 U/L (ref 15–41)
Albumin: 3.9 g/dL (ref 3.5–5.0)
Alkaline Phosphatase: 131 U/L — ABNORMAL HIGH (ref 38–126)
Anion gap: 14 (ref 5–15)
BUN: 9 mg/dL (ref 8–23)
CO2: 24 mmol/L (ref 22–32)
Calcium: 10.2 mg/dL (ref 8.9–10.3)
Chloride: 100 mmol/L (ref 98–111)
Creatinine, Ser: 1.14 mg/dL — ABNORMAL HIGH (ref 0.44–1.00)
GFR, Estimated: 55 mL/min — ABNORMAL LOW (ref >60)
Glucose, Bld: 112 mg/dL — ABNORMAL HIGH (ref 70–99)
Potassium: 2.8 mmol/L — ABNORMAL LOW (ref 3.5–5.1)
Sodium: 138 mmol/L (ref 135–145)
Total Bilirubin: 0.3 mg/dL (ref <1.2)
Total Protein: 7.6 g/dL (ref 6.5–8.1)

## 2023-09-29 LAB — URINALYSIS, W/ REFLEX TO CULTURE (INFECTION SUSPECTED)
Bacteria, UA: NONE SEEN
Glucose, UA: NEGATIVE mg/dL
Hgb urine dipstick: NEGATIVE
Ketones, ur: NEGATIVE mg/dL
Leukocytes,Ua: NEGATIVE
Nitrite: NEGATIVE
Protein, ur: 100 mg/dL — AB
Specific Gravity, Urine: 1.023 (ref 1.005–1.030)
pH: 5 (ref 5.0–8.0)

## 2023-09-29 LAB — CBC WITH DIFFERENTIAL/PLATELET
Abs Immature Granulocytes: 0.01 10*3/uL (ref 0.00–0.07)
Basophils Absolute: 0 10*3/uL (ref 0.0–0.1)
Basophils Relative: 0 %
Eosinophils Absolute: 0 10*3/uL (ref 0.0–0.5)
Eosinophils Relative: 0 %
HCT: 37.5 % (ref 36.0–46.0)
Hemoglobin: 11.5 g/dL — ABNORMAL LOW (ref 12.0–15.0)
Immature Granulocytes: 0 %
Lymphocytes Relative: 44 %
Lymphs Abs: 3.2 10*3/uL (ref 0.7–4.0)
MCH: 23.9 pg — ABNORMAL LOW (ref 26.0–34.0)
MCHC: 30.7 g/dL (ref 30.0–36.0)
MCV: 78 fL — ABNORMAL LOW (ref 80.0–100.0)
Monocytes Absolute: 1 10*3/uL (ref 0.1–1.0)
Monocytes Relative: 13 %
Neutro Abs: 3.1 10*3/uL (ref 1.7–7.7)
Neutrophils Relative %: 43 %
Platelets: 420 10*3/uL — ABNORMAL HIGH (ref 150–400)
RBC: 4.81 MIL/uL (ref 3.87–5.11)
RDW: 15.9 % — ABNORMAL HIGH (ref 11.5–15.5)
WBC: 7.3 10*3/uL (ref 4.0–10.5)
nRBC: 0 % (ref 0.0–0.2)

## 2023-09-29 LAB — I-STAT CG4 LACTIC ACID, ED
Lactic Acid, Venous: 2.5 mmol/L (ref 0.5–1.9)
Lactic Acid, Venous: 1.8 mmol/L (ref 0.5–1.9)

## 2023-09-29 LAB — RESP PANEL BY RT-PCR (RSV, FLU A&B, COVID)  RVPGX2
Influenza A by PCR: NEGATIVE
Influenza B by PCR: NEGATIVE
Resp Syncytial Virus by PCR: NEGATIVE
SARS Coronavirus 2 by RT PCR: NEGATIVE

## 2023-09-29 LAB — TROPONIN I (HIGH SENSITIVITY)
Troponin I (High Sensitivity): 3 ng/L (ref <18)
Troponin I (High Sensitivity): 6 ng/L (ref <18)

## 2023-09-29 LAB — PROTIME-INR
INR: 1 (ref 0.8–1.2)
Prothrombin Time: 13.5 s (ref 11.4–15.2)

## 2023-09-29 LAB — LIPASE, BLOOD: Lipase: 59 U/L — ABNORMAL HIGH (ref 11–51)

## 2023-09-29 LAB — TSH: TSH: 5.588 u[IU]/mL — ABNORMAL HIGH (ref 0.350–4.500)

## 2023-09-29 MED ORDER — ONDANSETRON HCL 4 MG/2ML IJ SOLN
4.0000 mg | Freq: Once | INTRAMUSCULAR | Status: AC
Start: 2023-09-29 — End: 2023-09-29
  Administered 2023-09-29: 4 mg via INTRAVENOUS
  Filled 2023-09-29: qty 2

## 2023-09-29 MED ORDER — SODIUM CHLORIDE 0.9 % IV BOLUS
1000.0000 mL | Freq: Once | INTRAVENOUS | Status: AC
Start: 2023-09-29 — End: 2023-09-29
  Administered 2023-09-29: 1000 mL via INTRAVENOUS

## 2023-09-29 MED ORDER — PROMETHAZINE HCL 25 MG PO TABS
25.0000 mg | ORAL_TABLET | Freq: Once | ORAL | Status: AC
Start: 2023-09-29 — End: 2023-09-29
  Administered 2023-09-29: 25 mg via ORAL
  Filled 2023-09-29: qty 1

## 2023-09-29 MED ORDER — PROMETHAZINE HCL 25 MG PO TABS: 25.0000 mg | ORAL_TABLET | Freq: Four times a day (QID) | ORAL | 0 refills | Status: DC | PRN

## 2023-09-29 MED ORDER — FENTANYL CITRATE PF 50 MCG/ML IJ SOSY
50.0000 ug | PREFILLED_SYRINGE | Freq: Once | INTRAMUSCULAR | Status: AC
  Administered 2023-09-29: 50 ug via INTRAVENOUS
  Filled 2023-09-29: qty 1

## 2023-09-29 MED ORDER — POTASSIUM CHLORIDE CRYS ER 20 MEQ PO TBCR
20.0000 meq | EXTENDED_RELEASE_TABLET | Freq: Once | ORAL | Status: AC
  Administered 2023-09-29: 20 meq via ORAL
  Filled 2023-09-29: qty 1

## 2023-09-29 MED ORDER — IOHEXOL 350 MG/ML SOLN
75.0000 mL | Freq: Once | INTRAVENOUS | Status: AC | PRN
  Administered 2023-09-29: 75 mL via INTRAVENOUS

## 2023-09-29 NOTE — ED Notes (Signed)
The pt my have left without her instructions she was tired of waiting  I came back to an empty room

## 2023-09-29 NOTE — ED Provider Notes (Signed)
[place] EMERGENCY DEPARTMENT AT [facility] Provider Note   CSN: [id number] Arrival date & time: 09/29/23  1346     History  Chief Complaint  Patient presents with   Weakness   Emesis   Near Syncope    Sabrina Snyder is a 62 y.o. female.  The history is provided by the patient, medical records and a relative. No language interpreter was used.  Weakness Severity:  Moderate Onset quality:  Gradual Timing:  Constant Progression:  Waxing and waning Chronicity:  New Relieved by:  Nothing Worsened by:  Nothing Ineffective treatments:  None tried Associated symptoms: abdominal pain, anorexia, cough, nausea, near-syncope, shortness of breath and vomiting   Associated symptoms: no chest pain, no diarrhea, no dizziness, no dysuria, no numbness in extremities, no fever, no foul-smelling urine, no frequency, no headaches, no loss of consciousness, no melena, no seizures and no stroke symptoms   Emesis Associated symptoms: abdominal pain, chills and cough   Associated symptoms: no diarrhea, no fever and no headaches   Near Syncope Associated symptoms include abdominal pain and shortness of breath. Pertinent negatives include no chest pain and no headaches.       Home Medications Prior to Admission medications   Medication Sig Start Date End Date Taking? Authorizing Provider  acetaminophen (TYLENOL) 650 MG CR tablet Take 650 mg by mouth 4 (four) times daily as needed for pain.    [provider]  albuterol (VENTOLIN HFA) 108 (90 Base) MCG/ACT inhaler Inhale 1-2 puffs into the lungs every 6 (six) hours as needed for wheezing or shortness of breath.    [provider]  ALPRAZolam Prudy Feeler) 0.5 MG tablet Take 0.5 mg by mouth 2 (two) times daily as needed for anxiety. 12/24/21   [provider]  amLODipine (NORVASC) 10 MG tablet TAKE 1 TABLET BY MOUTH EVERY DAY 08/06/23   Riley Lam A, MD  aspirin EC 325 MG tablet Take 325 mg by mouth  daily as needed for moderate pain.    [provider]  Baclofen 5 MG TABS Take 1 tablet by mouth 3 (three) times daily as needed (muscle spasms). 12/23/21   [provider]  buPROPion (WELLBUTRIN XL) 150 MG 24 hr tablet Take 150 mg by mouth daily.    [provider]  cyanocobalamin (,VITAMIN B-12,) 1000 MCG/ML injection Inject 1,000 mcg into the skin every 7 (seven) days. 01/11/18   [provider]  DULoxetine (CYMBALTA) 60 MG capsule TAKE 1 CAPSULE ONCE A DAY BY MOUTH DAILY 02/22/20   Cox, Kirsten, MD  EPINEPHrine 0.3 mg/0.3 mL IJ SOAJ injection Inject 0.3 mg into the muscle as needed for anaphylaxis.    [provider]  ezetimibe (ZETIA) 10 MG tablet Take 1 tablet (10 mg total) by mouth daily. 12/28/22   Chandrasekhar, Lafayette Dragon A, MD  gabapentin (NEURONTIN) 300 MG capsule Take 300 mg by mouth 3 (three) times daily. 12/23/21   [provider]  hydrochlorothiazide (HYDRODIURIL) 25 MG tablet TAKE 1 TABLET BY MOUTH EVERY DAY 04/01/20   Marianne Sofia, PA-C  levothyroxine (SYNTHROID) 112 MCG tablet Take 112 mcg by mouth daily before breakfast.    [provider]  loratadine (CLARITIN) 10 MG tablet Take 10 mg by mouth daily.    [provider]  losartan (COZAAR) 100 MG tablet Take 100 mg by mouth daily. 11/09/16   [provider]  MAGNESIUM PO Take 1 tablet by mouth daily as needed (constipation).    [provider]  methocarbamol (ROBAXIN) 500 MG tablet Take 1 tablet (500 mg total) by mouth every 6 (six) hours as needed for muscle spasms. 09/04/23   Julio Sicks, MD  Naltrexone HCl, Pain, (NALTREX) 4.5 MG CAPS Take 4.5 mg by mouth at bedtime.    [provider]  Omega-3 Fatty Acids (FISH OIL PO) Take 1 capsule by mouth daily.    [provider]  ondansetron (ZOFRAN) 4 MG tablet Take 1 tablet (4 mg total) by mouth every 6 (six) hours as needed for nausea or vomiting. 09/04/23   Julio Sicks, MD  oxyCODONE 10 MG  TABS Take 1 tablet (10 mg total) by mouth every 3 (three) hours as needed for severe pain (pain score 7-10) ((score 7 to 10)). 09/04/23   Julio Sicks, MD  Prasterone, DHEA, (DHEA PO) Take 1 tablet by mouth daily as needed (when taking magnesium).    [provider]      Allergies    Codeine, Doxycycline, Sulfa antibiotics, Ace inhibitors, and Nitrofurantoin    Review of Systems   Review of Systems  Constitutional:  Positive for appetite change, chills and fatigue. Negative for fever.  HENT:  Negative for congestion.   Eyes:  Negative for visual disturbance.  Respiratory:  Positive for cough and shortness of breath. Negative for chest tightness and wheezing.   Cardiovascular:  Positive for near-syncope. Negative for chest pain, palpitations and leg swelling.  Gastrointestinal:  Positive for abdominal pain, anorexia, constipation, nausea and vomiting. Negative for abdominal distention, diarrhea and melena.  Genitourinary:  Negative for dysuria and frequency.  Musculoskeletal:  Negative for back pain, neck pain and neck stiffness.  Skin:  Positive for wound. Negative for rash.  Neurological:  Positive for weakness and light-headedness. Negative for dizziness, seizures, loss of consciousness and headaches.  Psychiatric/Behavioral:  Negative for agitation and confusion.   All other systems reviewed and are negative.   Physical Exam Updated Vital Signs BP (!) 134/91 (BP Location: Right Arm)   Pulse (!) 111   Temp 98.4 F (36.9 C) (Oral)   Resp 18   Ht 5\' 2"  (1.575 m)   Wt 72.6 kg   SpO2 100%   BMI 29.26 kg/m  Physical Exam Vitals and nursing note reviewed.  Constitutional:      General: She is not in acute distress.    Appearance: She is well-developed. She is not ill-appearing, toxic-appearing or diaphoretic.  HENT:     Head: Normocephalic and atraumatic.     Nose: No congestion or rhinorrhea.     Mouth/Throat:     Mouth: Mucous membranes are dry.  Eyes:      Extraocular Movements: Extraocular movements intact.     Conjunctiva/sclera: Conjunctivae normal.     Pupils: Pupils are equal, round, and reactive to light.  Cardiovascular:     Rate and Rhythm: Regular rhythm. Tachycardia present.     Heart sounds: No murmur heard. Pulmonary:     Effort: Pulmonary effort is normal. No respiratory distress.     Breath sounds: Normal breath sounds. No wheezing, rhonchi or rales.  Chest:     Chest wall: No tenderness.  Abdominal:     General: Abdomen is flat.     Palpations: Abdomen is soft.     Tenderness: There is abdominal tenderness in the right lower quadrant. There is no guarding or rebound.  Musculoskeletal:        General: No swelling or tenderness.     Cervical back: Neck supple. No tenderness.  Right lower leg: No edema.     Left lower leg: No edema.  Skin:    General: Skin is warm and dry.     Capillary Refill: Capillary refill takes less than 2 seconds.     Findings: No erythema or rash.  Neurological:     General: No focal deficit present.     Mental Status: She is alert.     Sensory: No sensory deficit.     Motor: No weakness.  Psychiatric:        Mood and Affect: Mood normal.     ED Results / Procedures / Treatments   Labs (all labs ordered are listed, but only abnormal results are displayed) Labs Reviewed  COMPREHENSIVE METABOLIC PANEL - Abnormal; Notable for the following components:      Result Value   Potassium 2.8 (*)    Glucose, Bld 112 (*)    Creatinine, Ser 1.14 (*)    Alkaline Phosphatase 131 (*)    GFR, Estimated 55 (*)    All other components within normal limits  CBC WITH DIFFERENTIAL/PLATELET - Abnormal; Notable for the following components:   Hemoglobin 11.5 (*)    MCV 78.0 (*)    MCH 23.9 (*)    RDW 15.9 (*)    Platelets 420 (*)    All other components within normal limits  URINALYSIS, W/ REFLEX TO CULTURE (INFECTION SUSPECTED) - Abnormal; Notable for the following components:   APPearance HAZY (*)     Bilirubin Urine SMALL (*)    Protein, ur 100 (*)    All other components within normal limits  LIPASE, BLOOD - Abnormal; Notable for the following components:   Lipase 59 (*)    All other components within normal limits  TSH - Abnormal; Notable for the following components:   TSH 5.588 (*)    All other components within normal limits  I-STAT CG4 LACTIC ACID, ED - Abnormal; Notable for the following components:   Lactic Acid, Venous 2.5 (*)    All other components within normal limits  RESP PANEL BY RT-PCR (RSV, FLU A&B, COVID)  RVPGX2  CULTURE, BLOOD (ROUTINE X 2)  CULTURE, BLOOD (ROUTINE X 2)  PROTIME-INR  I-STAT CG4 LACTIC ACID, ED  TROPONIN I (HIGH SENSITIVITY)  TROPONIN I (HIGH SENSITIVITY)    EKG EKG Interpretation Date/Time:  Saturday September 29 2023 13:54:55 EST Ventricular Rate:  114 PR Interval:  160 QRS Duration:  66 QT Interval:  326 QTC Calculation: 449 R Axis:   2  Text Interpretation: Sinus tachycardia Anteroseptal infarct , age undetermined Abnormal ECG When compared with ECG of 28-Aug-2023 14:37, PREVIOUS ECG IS PRESENT when compared to prior, similar appearance to prior with faster rate. No STEMI Confirmed by Theda Belfast ([id number]) on 09/29/2023 3:16:05 PM  Radiology CT Angio Chest PE W and/or Wo Contrast  Result Date: 09/29/2023 CLINICAL DATA:  Right lower quadrant abdominal pain, sepsis, suspected pulmonary embolism EXAM: CT ANGIOGRAPHY CHEST CT ABDOMEN AND PELVIS WITH CONTRAST TECHNIQUE: Multidetector CT imaging of the chest was performed using the standard protocol during bolus administration of intravenous contrast. Multiplanar CT image reconstructions and MIPs were obtained to evaluate the vascular anatomy. Multidetector CT imaging of the abdomen and pelvis was performed using the standard protocol during bolus administration of intravenous contrast. RADIATION DOSE REDUCTION: This exam was performed according to the departmental dose-optimization  program which includes automated exposure control, adjustment of the mA and/or kV according to patient size and/or use of iterative reconstruction technique. CONTRAST:  75mL OMNIPAQUE IOHEXOL 350 MG/ML SOLN COMPARISON:  09/29/2023, 04/03/2022, 04/04/2022 FINDINGS: CTA CHEST FINDINGS Cardiovascular: This is a technically adequate evaluation of the pulmonary vasculature. No filling defects or pulmonary emboli. The heart is unremarkable without pericardial effusion. No evidence of thoracic aortic aneurysm or dissection. Mediastinum/Nodes: No enlarged mediastinal, hilar, or axillary lymph nodes. Thyroid gland, trachea, and esophagus demonstrate no significant findings. Lungs/Pleura: No acute airspace disease, effusion, or pneumothorax. Central airways are patent. Musculoskeletal: No acute or destructive bony abnormalities. Reconstructed images demonstrate no additional findings. Review of the MIP images confirms the above findings. CT ABDOMEN and PELVIS FINDINGS Hepatobiliary: No focal liver abnormality is seen. No gallstones, gallbladder wall thickening, or biliary dilatation. Pancreas: Unremarkable. No pancreatic ductal dilatation or surrounding inflammatory changes. Spleen: Normal in size without focal abnormality. Adrenals/Urinary Tract: There are bilateral less than 3 mm nonobstructing renal calculi, with no evidence of obstructive uropathy within either kidney. Kidneys enhance normally and symmetrically. The adrenals and bladder appear unremarkable. Stomach/Bowel: No bowel obstruction or ileus. Normal appendix right lower quadrant. No bowel wall thickening or inflammatory change. Vascular/Lymphatic: Aortic atherosclerosis. No enlarged abdominal or pelvic lymph nodes. Reproductive: Status post hysterectomy. No adnexal masses. Other: No free fluid or free intraperitoneal gas. No abdominal wall hernia. Musculoskeletal: No acute or destructive bony abnormalities. Postsurgical changes from discectomy and posterior  fusion spanning L2-3 through L4-5. Stable mild anterolisthesis of L4 relative to L5. A 3.6 x 5.9 cm fluid collection is seen surrounding the L1 spinous process, deep to the surgical incision site, most consistent with postoperative seroma or hematoma given recent spinal surgery. Reconstructed images demonstrate no additional findings. Review of the MIP images confirms the above findings. IMPRESSION: Chest: 1. No evidence of pulmonary embolus. 2. No acute intrathoracic process. Abdomen/pelvis: 1. Bilateral less than 3 mm nonobstructing renal calculi. 2. Normal appendix. 3. Postsurgical changes from recent L2-L5 discectomy and posterior fusion. Fluid collection surrounding the L1 spinous processes deep to the surgical incision site, favor residual postoperative seroma or hematoma. 4.  Aortic Atherosclerosis (ICD10-I70.0). Electronically Signed   By: Sharlet Salina M.D.   On: 09/29/2023 17:11   CT ABDOMEN PELVIS W CONTRAST  Result Date: 09/29/2023 CLINICAL DATA:  Right lower quadrant abdominal pain, sepsis, suspected pulmonary embolism EXAM: CT ANGIOGRAPHY CHEST CT ABDOMEN AND PELVIS WITH CONTRAST TECHNIQUE: Multidetector CT imaging of the chest was performed using the standard protocol during bolus administration of intravenous contrast. Multiplanar CT image reconstructions and MIPs were obtained to evaluate the vascular anatomy. Multidetector CT imaging of the abdomen and pelvis was performed using the standard protocol during bolus administration of intravenous contrast. RADIATION DOSE REDUCTION: This exam was performed according to the departmental dose-optimization program which includes automated exposure control, adjustment of the mA and/or kV according to patient size and/or use of iterative reconstruction technique. CONTRAST:  75mL OMNIPAQUE IOHEXOL 350 MG/ML SOLN COMPARISON:  09/29/2023, 04/03/2022, 04/04/2022 FINDINGS: CTA CHEST FINDINGS Cardiovascular: This is a technically adequate evaluation of the  pulmonary vasculature. No filling defects or pulmonary emboli. The heart is unremarkable without pericardial effusion. No evidence of thoracic aortic aneurysm or dissection. Mediastinum/Nodes: No enlarged mediastinal, hilar, or axillary lymph nodes. Thyroid gland, trachea, and esophagus demonstrate no significant findings. Lungs/Pleura: No acute airspace disease, effusion, or pneumothorax. Central airways are patent. Musculoskeletal: No acute or destructive bony abnormalities. Reconstructed images demonstrate no additional findings. Review of the MIP images confirms the above findings. CT ABDOMEN and PELVIS FINDINGS Hepatobiliary: No focal liver abnormality is seen. No gallstones, gallbladder wall thickening,  or biliary dilatation. Pancreas: Unremarkable. No pancreatic ductal dilatation or surrounding inflammatory changes. Spleen: Normal in size without focal abnormality. Adrenals/Urinary Tract: There are bilateral less than 3 mm nonobstructing renal calculi, with no evidence of obstructive uropathy within either kidney. Kidneys enhance normally and symmetrically. The adrenals and bladder appear unremarkable. Stomach/Bowel: No bowel obstruction or ileus. Normal appendix right lower quadrant. No bowel wall thickening or inflammatory change. Vascular/Lymphatic: Aortic atherosclerosis. No enlarged abdominal or pelvic lymph nodes. Reproductive: Status post hysterectomy. No adnexal masses. Other: No free fluid or free intraperitoneal gas. No abdominal wall hernia. Musculoskeletal: No acute or destructive bony abnormalities. Postsurgical changes from discectomy and posterior fusion spanning L2-3 through L4-5. Stable mild anterolisthesis of L4 relative to L5. A 3.6 x 5.9 cm fluid collection is seen surrounding the L1 spinous process, deep to the surgical incision site, most consistent with postoperative seroma or hematoma given recent spinal surgery. Reconstructed images demonstrate no additional findings. Review of the  MIP images confirms the above findings. IMPRESSION: Chest: 1. No evidence of pulmonary embolus. 2. No acute intrathoracic process. Abdomen/pelvis: 1. Bilateral less than 3 mm nonobstructing renal calculi. 2. Normal appendix. 3. Postsurgical changes from recent L2-L5 discectomy and posterior fusion. Fluid collection surrounding the L1 spinous processes deep to the surgical incision site, favor residual postoperative seroma or hematoma. 4.  Aortic Atherosclerosis (ICD10-I70.0). Electronically Signed   By: Sharlet Salina M.D.   On: 09/29/2023 17:11   DG Chest 2 View  Result Date: 09/29/2023 CLINICAL DATA:  Sepsis. EXAM: CHEST - 2 VIEW COMPARISON:  04/04/2022 FINDINGS: The heart size and mediastinal contours are within normal limits. Mild bibasilar scarring, without significant change. The lungs are otherwise clear. The visualized skeletal structures are unremarkable. IMPRESSION: No active cardiopulmonary disease. Electronically Signed   By: Danae Orleans M.D.   On: 09/29/2023 15:10    Procedures Procedures    Medications Ordered in ED Medications  sodium chloride 0.9 % bolus 1,000 mL (0 mLs Intravenous Stopped 09/29/23 1723)  ondansetron (ZOFRAN) injection 4 mg (4 mg Intravenous Given 09/29/23 1543)  potassium chloride SA (KLOR-CON M) CR tablet 20 mEq (20 mEq Oral Given 09/29/23 1557)  iohexol (OMNIPAQUE) 350 MG/ML injection 75 mL (75 mLs Intravenous Contrast Given 09/29/23 1635)  fentaNYL (SUBLIMAZE) injection 50 mcg (50 mcg Intravenous Given 09/29/23 1718)  promethazine (PHENERGAN) tablet 25 mg (25 mg Oral Given 09/29/23 2156)    ED Course/ Medical Decision Making/ A&P                                 Medical Decision Making Amount and/or Complexity of Data Reviewed Labs: ordered. Radiology: ordered.  Risk Prescription drug management.    TARSHA MARCY is a 62 y.o. female with a past medical history significant for hyperlipidemia, fibromyalgia, hypertension, asthma, CAD, kidney  stones, hypothyroidism, sleep apnea, and recent lumbar spine surgery who presents with 1 week of generalized fatigue, decreased appetite, some nausea that evolved into vomiting today, pain in her right lower abdomen, and shortness of breath.  On arrival, patient is tachycardic and is warm to the touch although she is afebrile technically here.  She did take some Tylenol before coming.  She is reporting no diarrhea but has some constipation.  She denies any urinary changes.  Denies any rashes.  She reports her back is sore but is not having any issues with her recent surgical site from last month.  She denies history  of blood clots but does have the tachycardia and shortness of breath.  She reports some dry cough that is mild.  Denies any sick exposures otherwise.  Denies any medication changes.  On my exam, she does have tenderness in the right lower quadrant but I did hear good bowel sounds.  Flanks and back nontender.  Surgical site is well-appearing without evidence of purulence or drainage.  Lungs were clear and I cannot reproduce discomfort.  No murmur.  Good pulses in extremities.  Normal sensation and strength in extremities.  Mucous brains are quite dry on exam.  Symmetric and reactive with normal extract movements.  No neck stiffness or nuchal rigidity.  Clinically expect patient to have a viral illness leading to decreased appetite and thus dehydration leading to her lightheadedness and fatigue.  She will get some fluids and received nausea medicine already.  With her tenderness in the right lower quadrant we will get a CT to rule out appendicitis.  With her shortness of breath tachycardia and tachypnea, will get a PE study to rule out pulm embolism.  Will also get troponin given the shortness of breath.  Will check her for COVID/flu given her chills.  Will give her some potassium to replete hypokalemia that was seen in triage for labs.  If workup reassuring, anticipate discharge home after  rehydration and medications.  Patient is feeling much better.    We went through all the findings and workup is reassuring.  No evidence of appendicitis or PE.  Suspect some dehydration from likely viral illness.  Patient agrees.  Will print prescription for Phenergan and patient will be discharged home for outpatient follow-up.  Patient agrees and will be discharged.         Final Clinical Impression(s) / ED Diagnoses Final diagnoses:  Dehydration  Abdominal pain, unspecified abdominal location  Shortness of breath    Rx / DC Orders ED Discharge Orders          Ordered    promethazine (PHENERGAN) 25 MG tablet  Every 6 hours PRN        09/29/23 2207            Clinical Impression: 1. Dehydration   2. Abdominal pain, unspecified abdominal location   3. Shortness of breath     Disposition: Discharge  Condition: Good  I have discussed the results, Dx and Tx plan with the pt(& family if present). He/she/they expressed understanding and agree(s) with the plan. Discharge instructions discussed at great length. Strict return precautions discussed and pt &/or family have verbalized understanding of the instructions. No further questions at time of discharge.    New Prescriptions   PROMETHAZINE (PHENERGAN) 25 MG TABLET    Take 1 tablet (25 mg total) by mouth every 6 (six) hours as needed for nausea or vomiting.    Follow Up: Darrow Bussing, MD [address] [place] [address] [phone]     [facility] Emergency Department at [facility] [address] [place] [address] [phone]       Maribel Luis, Canary Brim, MD 09/29/23 [phone]

## 2023-09-29 NOTE — Discharge Instructions (Signed)
Your history, exam, workup today seem consistent with a likely viral infection leading to dehydration and feeling like you did.  The CT scans were reassuring with no evidence of appendicitis or other intra-abdominal pathology.  There is no blood clot or evidence of pneumonia.  Your labs are overall reassuring but consistent with some dehydration.  After fluids you have improved.  We feel you are safe for discharge home.  Please follow-up with your primary doctor and your surgical team.  Please use the nausea medicine.  If any symptoms change or worsen acutely, please turn to the nearest emergency department.

## 2023-09-29 NOTE — ED Notes (Signed)
The pt has been not feeling well since she had surgery approx one month ago  she is concerned about her bms also  she had a bm this am

## 2023-09-29 NOTE — ED Notes (Signed)
To ct

## 2023-09-29 NOTE — ED Notes (Signed)
Up to the r with assistance

## 2023-09-29 NOTE — ED Triage Notes (Signed)
Pt c.o feeling generally weak, vomiting, feels like she is going to faint x1 week. Pt seen by her PCP yesterday and was told to come here if she felt worse.

## 2023-09-29 NOTE — ED Provider Triage Note (Signed)
Emergency Medicine Provider Triage Evaluation Note  Sabrina Snyder , a 62 y.o. female  was evaluated in triage.  Pt complains of 1 week of generalized weakness and fatigue, patient reports decreased p.o. intake and vomiting associate with same.  Review of Systems  Positive: Weakness, vomiting Negative: Chest pain, shortness of breath  Physical Exam  BP (!) 134/91 (BP Location: Right Arm)   Pulse (!) 111   Temp 98.4 F (36.9 C) (Oral)   Resp 18   Ht 5\' 2"  (1.575 m)   Wt 72.6 kg   SpO2 100%   BMI 29.26 kg/m  Gen:   Awake, no distress   Resp:  Normal effort  MSK:   Moves extremities without difficulty  Other:    Medical Decision Making  Medically screening exam initiated at 2:46 PM.  Appropriate orders placed.  Sabrina Snyder was informed that the remainder of the evaluation will be completed by another provider, this initial triage assessment does not replace that evaluation, and the importance of remaining in the ED until their evaluation is complete.  Patient would benefit from completion of ED evaluation.   Sabrina Fines, MD 09/29/23 [phone]

## 2023-09-30 DIAGNOSIS — E860 Dehydration: Secondary | ICD-10-CM | POA: Diagnosis not present

## 2023-10-04 LAB — CULTURE, BLOOD (ROUTINE X 2)
Culture: NO GROWTH
Special Requests: ADEQUATE

## 2023-10-08 DIAGNOSIS — F4321 Adjustment disorder with depressed mood: Secondary | ICD-10-CM | POA: Diagnosis not present

## 2023-10-08 DIAGNOSIS — F411 Generalized anxiety disorder: Secondary | ICD-10-CM | POA: Diagnosis not present

## 2023-10-16 DIAGNOSIS — R42 Dizziness and giddiness: Secondary | ICD-10-CM | POA: Diagnosis not present

## 2023-10-19 ENCOUNTER — Other Ambulatory Visit: Payer: Self-pay | Admitting: Internal Medicine

## 2023-10-23 DIAGNOSIS — M48062 Spinal stenosis, lumbar region with neurogenic claudication: Secondary | ICD-10-CM | POA: Diagnosis not present

## 2023-10-23 NOTE — Telephone Encounter (Signed)
This is Dr. [name] pt. She is at her final attempt on this medication, and I need approval to continue. Please advise. Thanks

## 2023-10-24 ENCOUNTER — Other Ambulatory Visit: Payer: Self-pay | Admitting: Internal Medicine

## 2023-10-24 MED ORDER — AMLODIPINE BESYLATE 10 MG PO TABS: 10.0000 mg | ORAL_TABLET | Freq: Every day | ORAL | 0 refills | Status: DC

## 2023-11-05 DIAGNOSIS — F4323 Adjustment disorder with mixed anxiety and depressed mood: Secondary | ICD-10-CM | POA: Diagnosis not present

## 2023-11-13 DIAGNOSIS — F4323 Adjustment disorder with mixed anxiety and depressed mood: Secondary | ICD-10-CM | POA: Diagnosis not present

## 2023-11-20 NOTE — Progress Notes (Signed)
 Cardiology Office Note:  .   Date:  11/22/2023  ID:  Sabrina Snyder, DOB 10/05/1961, MRN [id number] PCP: Regino Slater, MD  [facility] Providers Cardiologist:  Stanly DELENA Leavens, MD }   History of Present Illness: .   Sabrina Snyder is a 63 y.o. female with history of chronic fatigue syndrome, (on disability), fibromyalgia, hypertension, hyperlipidemia, and hypothyroidism.  Patient had minimal nonobstructive CAD per testing on 01/31/2022, she was to continue amlodipine , losartan , and omega-3 fatty acids.  She was started on Pravastatin  20 mg p.o. daily which was changed from rosuvastatin  5 mg because of myalgias; with coronary calcium  score of 14.  Last seen by Dr. Maron on 04/17/2022.  Since being seen last she has had lumbar spine surgery in October 2024, is slowly getting her strength back and will begin physical therapy after being cleared by her surgeon.  She will see him in February 2025.  She continues to soreness in her back but nothing significant.  She has been having some lightheadedness and dizziness when getting from a sitting position to a standing position sometimes out of a car or in her home and has to stop for a minute to get her bearings.  She has not fallen, she has not had any syncopal episodes.  She has been having some discomfort in her left shoulder and arm on occasion but not long-lasting or extremely painful.  3.   ROS: As above otherwise negative  Studies Reviewed: SABRA   EKG Interpretation Date/Time:  Thursday November 22 2023 10:43:14 EST Ventricular Rate:  99 PR Interval:  168 QRS Duration:  64 QT Interval:  358 QTC Calculation: 459 R Axis:   7  Text Interpretation: Normal sinus rhythm Low voltage QRS Septal infarct (cited on or before 28-Aug-2023) When compared with ECG of 29-Sep-2023 13:54, Questionable change in initial forces of Anterior leads Nonspecific T wave abnormality no longer evident in Lateral leads Confirmed by Jerilynn Collar [phone]) on 11/22/2023 10:59:39 AM  CAC: 01/31/2022 1. Calcium  score 14 isolated to LAD this is 76 th percentile for age / sex   2.  CAD RADS 1 non obstructive CAD in LAD see description above   3.  Normal ascending thoracic aorta 2.9 cm   Maude Emmer   Electronically Signed: By: Maude Emmer M.D. On: 01/31/2022 14:34     Physical Exam:   VS:  BP 94/65 (BP Location: Right Arm, Patient Position: Sitting, Cuff Size: Normal)   Pulse (!) 105   Ht 5' 2 (1.575 m)   Wt 161 lb 12.8 oz (73.4 kg)   SpO2 94%   BMI 29.59 kg/m    Wt Readings from Last 3 Encounters:  11/22/23 161 lb 12.8 oz (73.4 kg)  09/29/23 160 lb (72.6 kg)  09/03/23 165 lb (74.8 kg)    GEN: Well nourished, well developed in no acute distress NECK: No JVD; No carotid bruits CARDIAC: RRR, tachycardic, no murmurs, rubs, gallops RESPIRATORY:  Clear to auscultation without rales, wheezing or rhonchi  ABDOMEN: Soft, non-tender, non-distended EXTREMITIES:  No edema; No deformity   ASSESSMENT AND PLAN: .    Minimal nonobstructive disease: Noted on coronary CTA in 2023.  She denies any chest pain or dyspnea on exertion.  She does have some left shoulder and arm discomfort on occasion.  It is short-lived.  Will continue secondary management/prevention with blood pressure control, lipid management purposeful exercise, and weight loss.   2.  Hypertension: Blood pressure has been running low  at home and she has been having some lightheadedness and dizziness when going from sitting to a standing position.  I did not do orthostatics as she has had back surgery and it is difficult for her to lay flat on the table.  The patient will have decrease in her amlodipine  from 10 mg daily to 7.5 mg daily to see if this is helpful.  Can always titrate down if blood pressure remains low and she is symptomatic.  She has been on some muscle relaxers and pain medications in the past and only takes the muscle relaxers now as needed so I do  not think that this is playing a part in her hypotension.  She has lost about 10 pounds which may be influencing her pressure.  She has a follow-up next month with her surgeon hopefully blood pressure will be better, and she has a follow-up blood pressure with Dr. Lorrane in March 2025.  3. Hyperlipidemia: Intolerant of statins causing myalgias.  He is tolerating pravastatin  20 mg daily.  Labs are followed by PCP.  4.  Hypothyroidism: Medications are adjusted by PCP and have been titrated down lower since her surgery           Signed, Lamarr HERO. Jerilynn CHOL, ANP, AACC Thank you okay

## 2023-11-22 ENCOUNTER — Encounter: Payer: Self-pay | Admitting: Adult Health

## 2023-11-22 ENCOUNTER — Ambulatory Visit: Payer: 59 | Attending: Adult Health | Admitting: Adult Health

## 2023-11-22 VITALS — BP 94/65 | HR 105 | Ht 62.0 in | Wt 161.8 lb

## 2023-11-22 DIAGNOSIS — I10 Essential (primary) hypertension: Secondary | ICD-10-CM

## 2023-11-22 DIAGNOSIS — I2510 Atherosclerotic heart disease of native coronary artery without angina pectoris: Secondary | ICD-10-CM | POA: Diagnosis not present

## 2023-11-22 DIAGNOSIS — E7800 Pure hypercholesterolemia, unspecified: Secondary | ICD-10-CM

## 2023-11-22 DIAGNOSIS — E039 Hypothyroidism, unspecified: Secondary | ICD-10-CM

## 2023-11-22 MED ORDER — AMLODIPINE BESYLATE 5 MG PO TABS: 7.5000 mg | ORAL_TABLET | Freq: Every day | ORAL | 3 refills | Status: DC

## 2023-11-22 NOTE — Patient Instructions (Signed)
 Medication Instructions:  Decrease Amlodipine   to 7.5 mg ( Take 1.5  Tablets of a 5 mg Tablet). *If you need a refill on your cardiac medications before your next appointment, please call your pharmacy*   Lab Work: No Labs If you have labs (blood work) drawn today and your tests are completely normal, you will receive your results only by: MyChart Message (if you have MyChart) OR A paper copy in the mail If you have any lab test that is abnormal or we need to change your treatment, we will call you to review the results.   Testing/Procedures: No Testing   Follow-Up: At [facility], you and your health needs are our priority.  As part of our continuing mission to provide you with exceptional heart care, we have created designated Provider Care Teams.  These Care Teams include your primary Cardiologist (physician) and Advanced Practice Providers (APPs -  Physician Assistants and Nurse Practitioners) who all work together to provide you with the care you need, when you need it.  We recommend signing up for the patient portal called MyChart.  Sign up information is provided on this After Visit Summary.  MyChart is used to connect with patients for Virtual Visits (Telemedicine).  Patients are able to view lab/test results, encounter notes, upcoming appointments, etc.  Non-urgent messages can be sent to your provider as well.   To learn more about what you can do with MyChart, go to [email or web address].    Your next appointment:   Keep Scheduled Appointment  Provider:   Stanly DELENA Leavens, MD

## 2023-11-27 DIAGNOSIS — M48062 Spinal stenosis, lumbar region with neurogenic claudication: Secondary | ICD-10-CM | POA: Diagnosis not present

## 2023-12-27 DIAGNOSIS — Z23 Encounter for immunization: Secondary | ICD-10-CM | POA: Diagnosis not present

## 2023-12-27 DIAGNOSIS — Z0000 Encounter for general adult medical examination without abnormal findings: Secondary | ICD-10-CM | POA: Diagnosis not present

## 2023-12-27 DIAGNOSIS — E538 Deficiency of other specified B group vitamins: Secondary | ICD-10-CM | POA: Diagnosis not present

## 2023-12-27 DIAGNOSIS — G720 Drug-induced myopathy: Secondary | ICD-10-CM | POA: Diagnosis not present

## 2023-12-27 DIAGNOSIS — E559 Vitamin D deficiency, unspecified: Secondary | ICD-10-CM | POA: Diagnosis not present

## 2023-12-27 DIAGNOSIS — E7800 Pure hypercholesterolemia, unspecified: Secondary | ICD-10-CM | POA: Diagnosis not present

## 2023-12-27 DIAGNOSIS — E039 Hypothyroidism, unspecified: Secondary | ICD-10-CM | POA: Diagnosis not present

## 2023-12-27 DIAGNOSIS — E611 Iron deficiency: Secondary | ICD-10-CM | POA: Diagnosis not present

## 2023-12-27 DIAGNOSIS — N1831 Chronic kidney disease, stage 3a: Secondary | ICD-10-CM | POA: Diagnosis not present

## 2023-12-28 DIAGNOSIS — G4733 Obstructive sleep apnea (adult) (pediatric): Secondary | ICD-10-CM | POA: Diagnosis not present

## 2023-12-28 DIAGNOSIS — R5383 Other fatigue: Secondary | ICD-10-CM | POA: Diagnosis not present

## 2024-01-03 ENCOUNTER — Other Ambulatory Visit: Payer: Self-pay | Admitting: Internal Medicine

## 2024-01-09 DIAGNOSIS — M6281 Muscle weakness (generalized): Secondary | ICD-10-CM | POA: Diagnosis not present

## 2024-01-09 DIAGNOSIS — M25559 Pain in unspecified hip: Secondary | ICD-10-CM | POA: Diagnosis not present

## 2024-01-09 DIAGNOSIS — R2689 Other abnormalities of gait and mobility: Secondary | ICD-10-CM | POA: Diagnosis not present

## 2024-01-09 DIAGNOSIS — M5440 Lumbago with sciatica, unspecified side: Secondary | ICD-10-CM | POA: Diagnosis not present

## 2024-01-09 DIAGNOSIS — M25561 Pain in right knee: Secondary | ICD-10-CM | POA: Diagnosis not present

## 2024-01-18 DIAGNOSIS — R42 Dizziness and giddiness: Secondary | ICD-10-CM | POA: Diagnosis not present

## 2024-01-18 DIAGNOSIS — D649 Anemia, unspecified: Secondary | ICD-10-CM | POA: Diagnosis not present

## 2024-01-22 DIAGNOSIS — D649 Anemia, unspecified: Secondary | ICD-10-CM | POA: Diagnosis not present

## 2024-01-25 DIAGNOSIS — M25559 Pain in unspecified hip: Secondary | ICD-10-CM | POA: Diagnosis not present

## 2024-01-25 DIAGNOSIS — M5440 Lumbago with sciatica, unspecified side: Secondary | ICD-10-CM | POA: Diagnosis not present

## 2024-01-25 DIAGNOSIS — M6281 Muscle weakness (generalized): Secondary | ICD-10-CM | POA: Diagnosis not present

## 2024-01-25 DIAGNOSIS — M25561 Pain in right knee: Secondary | ICD-10-CM | POA: Diagnosis not present

## 2024-01-25 DIAGNOSIS — R2689 Other abnormalities of gait and mobility: Secondary | ICD-10-CM | POA: Diagnosis not present

## 2024-01-28 DIAGNOSIS — E611 Iron deficiency: Secondary | ICD-10-CM | POA: Diagnosis not present

## 2024-01-30 DIAGNOSIS — M4310 Spondylolisthesis, site unspecified: Secondary | ICD-10-CM | POA: Diagnosis not present

## 2024-02-08 ENCOUNTER — Ambulatory Visit: Payer: 59 | Attending: Internal Medicine | Admitting: Internal Medicine

## 2024-02-08 VITALS — BP 100/58 | HR 84 | Ht 62.0 in | Wt 158.4 lb

## 2024-02-08 DIAGNOSIS — I10 Essential (primary) hypertension: Secondary | ICD-10-CM

## 2024-02-08 DIAGNOSIS — I2510 Atherosclerotic heart disease of native coronary artery without angina pectoris: Secondary | ICD-10-CM | POA: Insufficient documentation

## 2024-02-08 DIAGNOSIS — I25118 Atherosclerotic heart disease of native coronary artery with other forms of angina pectoris: Secondary | ICD-10-CM

## 2024-02-08 DIAGNOSIS — R079 Chest pain, unspecified: Secondary | ICD-10-CM

## 2024-02-08 MED ORDER — AMLODIPINE BESYLATE 5 MG PO TABS: 5.0000 mg | ORAL_TABLET | Freq: Every day | ORAL | 3 refills | Status: DC

## 2024-02-08 NOTE — Progress Notes (Signed)
 Cardiology Office Note:  .    Date:  02/08/2024  ID:  AKYAH Snyder, DOB 29-Dec-1960, MRN [id number] PCP: Darrow Bussing, MD  [facility] Providers Cardiologist:  Christell Constant, MD     CC: Follow up CAD   History of Present Illness: .    Sabrina Snyder is Sabrina 63 y.o. female with coronary artery disease who presents with chest pain.  She experiences chest pain radiating down her left arm, particularly during stress or emotional upset. She is concerned about Sabrina heart attack, especially given Sabrina previous EKG indicating Sabrina septal infarction, confirmed on Sabrina repeat test. Her family history of heart disease heightens her concern.  She has Sabrina history of coronary artery disease with nonobstructive findings. She manages her cholesterol with ezetimibe for about Sabrina year after intolerance to two different statins. She avoids meat and is trying to increase physical activity, though she remains cautious due to her heart condition.  She experiences episodes of dizziness, with Sabrina recent incident at Sabrina grocery store. Her home blood pressure readings are typically around 120/68 mmHg. She was previously on amlodipine 10 mg, adjusted to 7.5 mg, but has returned to 10 mg. She experiences symptomatic hypotension.  She is dealing with anemia of unknown origin. Sabrina stool test showed no blood, and she is scheduled to see Sabrina gastroenterologist for further evaluation. She is currently taking iron supplements.  Her family history is significant for heart disease, with Sabrina cousin who died of Sabrina heart attack at 11 and Sabrina grandfather who passed away from heart-related issues. Her father has survived multiple health challenges, including pancreatic cancer.    Relevant histories: .  Social - established with me 2023 with CAC on CT ROS: As per HPI.   Studies Reviewed: .   Cardiac Studies & Procedures   ______________________________________________________________________________________________   STRESS  TESTS  MYOCARDIAL PERFUSION IMAGING 02/20/2018  Narrative  Nuclear stress EF: 75%.  Blood pressure demonstrated Sabrina hypertensive response to exercise.  There was no ST segment deviation noted during stress.  No T wave inversion was noted during stress.  Defect 1: There is Sabrina small defect of mild severity present in the mid anterolateral and apical lateral location. This is worse at rest than stress, consistent with artifact.  This is Sabrina low risk study.  The left ventricular ejection fraction is hyperdynamic (>65%).   ECHOCARDIOGRAM  ECHOCARDIOGRAM COMPLETE 01/03/2022  Narrative ECHOCARDIOGRAM REPORT    Patient Name:   Sabrina Snyder [facility] Date of Exam: 01/03/2022 Medical Rec #:  [id number]        Height:       62.0 in Accession #:    [id number]       Weight:       164.0 lb Date of Birth:  [birth year]        BSA:          1.757 m Patient Age:    60 years         BP:           135/97 mmHg Patient Gender: F                HR:           93 bpm. Exam Location:  [address]  Procedure: 2D Echo, Cardiac Doppler and Color Doppler  Indications:    R06.09 SOB  History:        Patient has no prior history of Echocardiogram examinations. Signs/Symptoms:Shortness of Breath; Risk Factors:Hypertension and  Dyslipidemia.  Sonographer:    Samule Ohm RDCS Referring Phys: [id number] [facility] Sabrina Willaim Mode  IMPRESSIONS   1. Left ventricular ejection fraction, by estimation, is 60 to 65%. The left ventricle has normal function. The left ventricle has no regional wall motion abnormalities. There is mild left ventricular hypertrophy. Left ventricular diastolic parameters were normal. 2. Right ventricular systolic function is normal. The right ventricular size is normal. 3. The mitral valve is normal in structure. Trivial mitral valve regurgitation. No evidence of mitral stenosis. 4. The aortic valve is tricuspid. Aortic valve regurgitation is not visualized. No aortic stenosis is present. 5.  The inferior vena cava is normal in size with greater than 50% respiratory variability, suggesting right atrial pressure of 3 mmHg.  FINDINGS Left Ventricle: Left ventricular ejection fraction, by estimation, is 60 to 65%. The left ventricle has normal function. The left ventricle has no regional wall motion abnormalities. The left ventricular internal cavity size was normal in size. There is mild left ventricular hypertrophy. Left ventricular diastolic parameters were normal.  Right Ventricle: The right ventricular size is normal. No increase in right ventricular wall thickness. Right ventricular systolic function is normal.  Left Atrium: Left atrial size was normal in size.  Right Atrium: Right atrial size was normal in size.  Pericardium: There is no evidence of pericardial effusion.  Mitral Valve: The mitral valve is normal in structure. Trivial mitral valve regurgitation. No evidence of mitral valve stenosis.  Tricuspid Valve: The tricuspid valve is normal in structure. Tricuspid valve regurgitation is trivial. No evidence of tricuspid stenosis.  Aortic Valve: The aortic valve is tricuspid. Aortic valve regurgitation is not visualized. No aortic stenosis is present.  Pulmonic Valve: The pulmonic valve was normal in structure. Pulmonic valve regurgitation is mild. No evidence of pulmonic stenosis.  Aorta: The aortic root is normal in size and structure.  Venous: The inferior vena cava is normal in size with greater than 50% respiratory variability, suggesting right atrial pressure of 3 mmHg.  IAS/Shunts: No atrial level shunt detected by color flow Doppler.   LEFT VENTRICLE PLAX 2D LVIDd:         3.80 cm   Diastology LVIDs:         2.60 cm   LV e' medial:    7.18 cm/s LV PW:         1.00 cm   LV E/e' medial:  12.0 LV IVS:        1.20 cm   LV e' lateral:   14.50 cm/s LVOT diam:     1.60 cm   LV E/e' lateral: 6.0 LV SV:         47 LV SV Index:   27 LVOT Area:     2.01  cm   RIGHT VENTRICLE             IVC RV S prime:     24.90 cm/s  IVC diam: 1.40 cm TAPSE (M-mode): 1.8 cm RVSP:           27.0 mmHg  LEFT ATRIUM             Index        RIGHT ATRIUM           Index LA diam:        3.30 cm 1.88 cm/m   RA Pressure: 3.00 mmHg LA Vol (A2C):   56.3 ml 32.04 ml/m  RA Area:     11.70 cm LA Vol (A4C):   45.4  ml 25.84 ml/m  RA Volume:   24.30 ml  13.83 ml/m LA Biplane Vol: 53.4 ml 30.39 ml/m AORTIC VALVE LVOT Vmax:   129.00 cm/s LVOT Vmean:  85.400 cm/s LVOT VTI:    0.232 m  AORTA Ao Root diam: 3.00 cm Ao Asc diam:  3.00 cm  MITRAL VALVE                TRICUSPID VALVE MV Area (PHT): 3.93 cm     TR Peak grad:   24.0 mmHg MV Decel Time: 193 msec     TR Vmax:        245.00 cm/s MV E velocity: 86.50 cm/s   Estimated RAP:  3.00 mmHg MV Sabrina velocity: 102.00 cm/s  RVSP:           27.0 mmHg MV E/Sabrina ratio:  0.85 SHUNTS Systemic VTI:  0.23 m Systemic Diam: 1.60 cm  Charlton Haws MD Electronically signed by Charlton Haws MD Signature Date/Time: 01/03/2022/4:39:45 PM    Final      CT SCANS  CT CORONARY MORPH W/CTA COR W/SCORE 01/31/2022  Addendum 01/31/2022  2:58 PM ADDENDUM REPORT: 01/31/2022 14:56  ADDENDUM: The following report is an over-read performed by radiologist Dr. Maudry Mayhew of [facility] on January 31, 2022. This over-read does not include interpretation of cardiac or coronary anatomy or pathology. The coronary calcium score/coronary CTA interpretation by the cardiologist is attached.  COMPARISON:  CT October 29, 2020  FINDINGS: Vascular: No acute non-cardiac vascular finding.  Mediastinum/Nodes: No pathologically enlarged mediastinal, or hilar lymph nodes, noting limited sensitivity for the detection of hilar adenopathy on this noncontrast study. Visualized portions of the esophagus are grossly unremarkable  Lungs/Pleura: Within the visualized portions of the thorax there are no suspicious appearing  pulmonary nodules or masses, there is no acute consolidative airspace disease, no pleural effusions and no pneumothorax  Upper Abdomen: Visualized portions of the upper abdomen are unremarkable.  Musculoskeletal: There are no aggressive appearing lytic or blastic lesions noted in the visualized portions of the skeleton.  IMPRESSION: No significant incidental noncardiac finding noted.   Electronically Signed By: Maudry Mayhew M.D. On: 01/31/2022 14:56  Narrative CLINICAL DATA:  Chest pain  EXAM: Cardiac CTA  MEDICATIONS: Sub lingual nitro. 4mg  and lopressor 100mg   TECHNIQUE: The patient was scanned on Sabrina Siemens Force 192 slice scanner. Gantry rotation speed was 250 msecs. Collimation was .6 mm. Sabrina 100 kV prospective scan was triggered in the ascending thoracic aorta at 140 HU's Full mA was used between 35% and 75% of the R-R interval. Average HR during the scan was 65 bpm. The 3D data set was interpreted on Sabrina dedicated work station using MPR, MIP and VRT modes. Sabrina total of 80 cc of contrast was used.  FINDINGS: Non-cardiac: See separate report from [facility]. No significant findings on limited lung and soft tissue windows.  Calcium Score: Noted only in proximal LAD  Coronary Arteries: Right dominant with no anomalies  LM: Normal  LAD: 1-24% mixed plaque in ostial/proximal LAD  D1: Normal  D2: Normal  Circumflex: Normal  OM1: Normal  OM2: Normal  RCA: Normal  PDA: Normal  PLA: Normal  IMPRESSION: 1. Calcium score 14 isolated to LAD this is 31 th percentile for age / sex  2.  CAD RADS 1 non obstructive CAD in LAD see description above  3.  Normal ascending thoracic aorta 2.9 cm  Charlton Haws  Electronically Signed: By: Charlton Haws M.D. On: 01/31/2022  14:34     ______________________________________________________________________________________________      Physical Exam:    VS:  BP (!) 100/58 (BP Location: Right Arm)    Pulse 84   Ht 5\' 2"  (1.575 m)   Wt 71.8 kg   SpO2 96%   BMI 28.97 kg/m    Wt Readings from Last 3 Encounters:  02/08/24 71.8 kg  11/22/23 73.4 kg  09/29/23 72.6 kg    Gen: no distress   Neck: No JVD Cardiac: No Rubs or Gallops, no murmur, RRR +2radial pulses Respiratory: Clear to auscultation bilaterally, normal  effort, normal  respiratory rate GI: Soft, nontender, non-distended  MS: No  edema;  moves all extremities Integument: Skin feels warm Neuro:  At time of evaluation, alert and oriented to person/place/time/situation  Psych: Normal affect, patient feels ok   ASSESSMENT AND PLAN: .    Chest Pain Intermittent chest pain radiating to the left arm, exacerbated by emotional stress. Minimal nonobstructive coronary artery disease with previous EKGs showing septal infarction pattern, not definitive for past myocardial infarction.  - Order Lexiscan nuclear medicine stress test to evaluate for ischemia or past myocardial infarction. - Discuss potential stress test outcomes, including identifying ischemia or confirming past myocardial infarction. - If ischemia is identified, consider medication adjustments and expedite follow-up to discuss options (LHC)  Coronary Artery Disease Minimal nonobstructive coronary artery disease with suboptimal cholesterol control. LDL cholesterol at 90 mg/dL, above target of 70 mg/dL, despite dietary efforts and ezetimibe therapy. Adverse reactions to statins noted. Exploring bempedoic acid for further cholesterol management.   Symptomatic Hypotension Episodes of dizziness potentially related to hypotension. Blood pressure lower than usual, possibly due to recent changes in activity level and treatment for iron deficiency anemia. - Reduce amlodipine dosage from 10 mg to 5 mg daily to manage hypotension. - Monitor blood pressure and symptoms to assess response to dosage adjustment.  Iron Deficiency Anemia Iron deficiency anemia of unknown etiology.  Under evaluation by primary care physician with plans for gastroenterology referral. Currently on iron supplementation. - would not pursue LHC until cleared by GI  Three month f/u with Lucillie Garfinkel, MD FASE [facility] Cardiologist [facility]  [facility]  [address] [place], [address] [place], [place] [address] [phone]  5:07 PM

## 2024-02-08 NOTE — Patient Instructions (Addendum)
 Medication Instructions:  Your physician has recommended you make the following change in your medication:  DECREASE: amlodipine (Norvasc) to 5 mg by mouth once daily  *If you need a refill on your cardiac medications before your next appointment, please call your pharmacy*  Lab Work: NONE  If you have labs (blood work) drawn today and your tests are completely normal, you will receive your results only by: MyChart Message (if you have MyChart) OR A paper copy in the mail If you have any lab test that is abnormal or we need to change your treatment, we will call you to review the results.  Testing/Procedures: Your physician has requested that you have a lexiscan myoview. For further information please visit [email or web address] Please follow instruction sheet, as given.    You are scheduled for a Myocardial Perfusion Imaging Study. Please arrive 15 minutes prior to your appointment time for registration and insurance purposes.   The test will take approximately 3 to 4 hours to complete; you may bring reading material.  If someone comes with you to your appointment, they will need to remain in the main lobby due to limited space in the testing area.    How to prepare for your Myocardial Perfusion Test: Do not eat or drink 3 hours prior to your test, except you may have water. Do not consume products containing caffeine (regular or decaffeinated) 12 hours prior to your test. (ex: coffee, chocolate, sodas, tea). Do bring a list of your current medications with you.  If not listed below, you may take your medications as normal. Do wear comfortable clothes (no dresses or overalls) and walking shoes, tennis shoes preferred (No heels or open toe shoes are allowed). Do NOT wear cologne, perfume, aftershave, or lotions (deodorant is allowed). If these instructions are not followed, your test will have to be rescheduled.  If you cannot keep your appointment, please provide 24 hours notification  to the Nuclear Lab, to avoid a possible $50 charge to your account.      Follow-Up: At [facility], you and your health needs are our priority.  As part of our continuing mission to provide you with exceptional heart care, our providers are all part of one team.  This team includes your primary Cardiologist (physician) and Advanced Practice Providers or APPs (Physician Assistants and Nurse Practitioners) who all work together to provide you with the care you need, when you need it.  Your next appointment:   3 month(s)  Provider:   Joni Reining, DNP, ANP      Other Instructions       1st Floor: - Lobby - Registration  - Pharmacy  - Lab - Cafe  2nd Floor: - PV Lab - Diagnostic Testing (echo, CT, nuclear med)  3rd Floor: - Vacant  4th Floor: - TCTS (cardiothoracic surgery) - AFib Clinic - Structural Heart Clinic - Vascular Surgery  - Vascular Ultrasound  5th Floor: - HeartCare Cardiology (general and EP) - Clinical Pharmacy for coumadin, hypertension, lipid, weight-loss medications, and med management appointments    Valet parking services will be available as well.

## 2024-02-11 ENCOUNTER — Other Ambulatory Visit (HOSPITAL_COMMUNITY): Payer: Self-pay

## 2024-02-11 ENCOUNTER — Telehealth: Payer: Self-pay | Admitting: Pharmacy Technician

## 2024-02-11 DIAGNOSIS — I25118 Atherosclerotic heart disease of native coronary artery with other forms of angina pectoris: Secondary | ICD-10-CM

## 2024-02-11 DIAGNOSIS — E7800 Pure hypercholesterolemia, unspecified: Secondary | ICD-10-CM

## 2024-02-11 NOTE — Telephone Encounter (Signed)
 Pharmacy Patient Advocate Encounter   Received notification from  staff  that prior authorization for nexlizet is required/requested.   Insurance verification completed.   The patient is insured through [employer] .   Per test claim: PA required; PA submitted to above mentioned insurance via CoverMyMeds Key/confirmation #/[facility] Status is pending

## 2024-02-12 ENCOUNTER — Other Ambulatory Visit (HOSPITAL_COMMUNITY): Payer: Self-pay

## 2024-02-12 NOTE — Telephone Encounter (Signed)
 Pharmacy Patient Advocate Encounter  Received notification from [facility] that Prior Authorization for nexlizet has been APPROVED from 02/11/24 to 08/12/24. Ran test claim, Copay is $0.00. This test claim was processed through [facility]- copay amounts may vary at other pharmacies due to pharmacy/plan contracts, or as the patient moves through the different stages of their insurance plan.   PA #/Case ID/Reference #: [id number]

## 2024-02-14 ENCOUNTER — Encounter (HOSPITAL_COMMUNITY): Payer: Self-pay

## 2024-02-15 MED ORDER — NEXLIZET 180-10 MG PO TABS: 1.0000 | ORAL_TABLET | Freq: Every day | ORAL | 3 refills | Status: AC

## 2024-02-15 NOTE — Addendum Note (Signed)
 Addended by: Malena Peer D on: 02/15/2024 01:00 PM   Modules accepted: Orders

## 2024-02-15 NOTE — Telephone Encounter (Signed)
 Spoke with patient about Nexlizet. She is willing to try. Knows to stop taking zetia when she starts taking nexlizet. Labs in 3 months. Orders placed.

## 2024-02-18 ENCOUNTER — Telehealth (HOSPITAL_COMMUNITY): Payer: Self-pay

## 2024-02-18 ENCOUNTER — Encounter (HOSPITAL_COMMUNITY): Payer: Self-pay

## 2024-02-18 NOTE — Telephone Encounter (Signed)
 Detailed instructions left on the patient's answering machine. She called 5 minutes ago to reschedule. Her upcoming appointment is Tuesday April 15, at 1:00. S.Ileah Falkenstein CCT

## 2024-02-20 DIAGNOSIS — R400 Somnolence: Secondary | ICD-10-CM | POA: Diagnosis not present

## 2024-02-20 DIAGNOSIS — J069 Acute upper respiratory infection, unspecified: Secondary | ICD-10-CM | POA: Diagnosis not present

## 2024-02-20 DIAGNOSIS — G4733 Obstructive sleep apnea (adult) (pediatric): Secondary | ICD-10-CM | POA: Diagnosis not present

## 2024-02-20 DIAGNOSIS — J4520 Mild intermittent asthma, uncomplicated: Secondary | ICD-10-CM | POA: Diagnosis not present

## 2024-02-22 ENCOUNTER — Ambulatory Visit (HOSPITAL_COMMUNITY)

## 2024-02-26 ENCOUNTER — Encounter (HOSPITAL_COMMUNITY): Payer: Self-pay

## 2024-02-26 ENCOUNTER — Ambulatory Visit (HOSPITAL_COMMUNITY): Attending: Cardiovascular Disease

## 2024-02-28 ENCOUNTER — Encounter (HOSPITAL_COMMUNITY): Payer: Self-pay

## 2024-02-29 ENCOUNTER — Ambulatory Visit (HOSPITAL_COMMUNITY): Attending: Internal Medicine

## 2024-02-29 ENCOUNTER — Encounter: Payer: Self-pay | Admitting: Internal Medicine

## 2024-02-29 DIAGNOSIS — R079 Chest pain, unspecified: Secondary | ICD-10-CM | POA: Diagnosis not present

## 2024-02-29 DIAGNOSIS — I25118 Atherosclerotic heart disease of native coronary artery with other forms of angina pectoris: Secondary | ICD-10-CM

## 2024-02-29 LAB — MYOCARDIAL PERFUSION IMAGING
LV dias vol: 41 mL (ref 46–106)
LV sys vol: 7 mL
Nuc Stress EF: 82 %
Peak HR: 102 {beats}/min
Rest HR: 88 {beats}/min
Rest Nuclear Isotope Dose: 10.6 mCi
SDS: 3
SRS: 2
SSS: 5
ST Depression (mm): 0 mm
Stress Nuclear Isotope Dose: 32.5 mCi
TID: 0.97

## 2024-02-29 MED ORDER — REGADENOSON 0.4 MG/5ML IV SOLN
0.4000 mg | Freq: Once | INTRAVENOUS | Status: AC
  Administered 2024-02-29: 0.4 mg via INTRAVENOUS

## 2024-02-29 MED ORDER — TECHNETIUM TC 99M TETROFOSMIN IV KIT
10.6000 | PACK | Freq: Once | INTRAVENOUS | Status: AC | PRN
Start: 2024-02-29 — End: 2024-02-29
  Administered 2024-02-29: 10.6 via INTRAVENOUS

## 2024-02-29 MED ORDER — TECHNETIUM TC 99M TETROFOSMIN IV KIT
32.5000 | PACK | Freq: Once | INTRAVENOUS | Status: AC | PRN
Start: 2024-02-29 — End: 2024-02-29
  Administered 2024-02-29: 32.5 via INTRAVENOUS

## 2024-03-08 DIAGNOSIS — H9201 Otalgia, right ear: Secondary | ICD-10-CM | POA: Diagnosis not present

## 2024-03-12 DIAGNOSIS — M2560 Stiffness of unspecified joint, not elsewhere classified: Secondary | ICD-10-CM | POA: Diagnosis not present

## 2024-03-12 DIAGNOSIS — M5489 Other dorsalgia: Secondary | ICD-10-CM | POA: Diagnosis not present

## 2024-03-12 DIAGNOSIS — R2689 Other abnormalities of gait and mobility: Secondary | ICD-10-CM | POA: Diagnosis not present

## 2024-03-12 DIAGNOSIS — M6281 Muscle weakness (generalized): Secondary | ICD-10-CM | POA: Diagnosis not present

## 2024-03-18 DIAGNOSIS — R2689 Other abnormalities of gait and mobility: Secondary | ICD-10-CM | POA: Diagnosis not present

## 2024-03-18 DIAGNOSIS — M2560 Stiffness of unspecified joint, not elsewhere classified: Secondary | ICD-10-CM | POA: Diagnosis not present

## 2024-03-18 DIAGNOSIS — M6281 Muscle weakness (generalized): Secondary | ICD-10-CM | POA: Diagnosis not present

## 2024-03-18 DIAGNOSIS — M5489 Other dorsalgia: Secondary | ICD-10-CM | POA: Diagnosis not present

## 2024-03-20 DIAGNOSIS — G4733 Obstructive sleep apnea (adult) (pediatric): Secondary | ICD-10-CM | POA: Diagnosis not present

## 2024-03-21 DIAGNOSIS — R2689 Other abnormalities of gait and mobility: Secondary | ICD-10-CM | POA: Diagnosis not present

## 2024-03-21 DIAGNOSIS — M2560 Stiffness of unspecified joint, not elsewhere classified: Secondary | ICD-10-CM | POA: Diagnosis not present

## 2024-03-21 DIAGNOSIS — M6281 Muscle weakness (generalized): Secondary | ICD-10-CM | POA: Diagnosis not present

## 2024-03-21 DIAGNOSIS — M5489 Other dorsalgia: Secondary | ICD-10-CM | POA: Diagnosis not present

## 2024-03-24 ENCOUNTER — Ambulatory Visit: Admitting: Physician Assistant

## 2024-03-24 NOTE — Progress Notes (Deleted)
 se     Brigitte Canard, PA-C [address] [place], [place]  [address] Phone: [phone]   Primary Care Physician: Lanae Pinal, MD  Primary Gastroenterologist:  Brigitte Canard, PA-C / Alvester Johnson, MD   Chief Complaint: Follow-up iron deficiency       HPI:   Sabrina Snyder is a 63 y.o. female  07/2022 screening colonoscopy by Dr. General Kenner: Good prep.  1 small 3 mm tubular adenoma polyp removed.  2 small benign colon mucosal polyps removed.  Small internal hemorrhoids.  Torturous colon.  7-year repeat colonoscopy (due 07/2029).  No previous EGD.  09/2023 last CBC showed hemoglobin 11.5, MCV 78.  Current Outpatient Medications  Medication Sig Dispense Refill   acetaminophen  (TYLENOL ) 650 MG CR tablet Take 650 mg by mouth 4 (four) times daily as needed for pain.     albuterol  (VENTOLIN  HFA) 108 (90 Base) MCG/ACT inhaler Inhale 1-2 puffs into the lungs every 6 (six) hours as needed for wheezing or shortness of breath.     ALPRAZolam  (XANAX ) 0.5 MG tablet Take 0.5 mg by mouth 2 (two) times daily as needed for anxiety.     amLODipine  (NORVASC ) 5 MG tablet Take 1 tablet (5 mg total) by mouth daily. 180 tablet 3   Baclofen  5 MG TABS Take 1 tablet by mouth 3 (three) times daily as needed (muscle spasms).     Bempedoic Acid-Ezetimibe  (NEXLIZET ) 180-10 MG TABS Take 1 tablet by mouth daily. 90 tablet 3   buPROPion  (WELLBUTRIN  XL) 150 MG 24 hr tablet Take 150 mg by mouth daily.     cyanocobalamin  (,VITAMIN B-12,) 1000 MCG/ML injection Inject 1,000 mcg into the skin every 7 (seven) days.  11   DULoxetine  (CYMBALTA ) 60 MG capsule TAKE 1 CAPSULE ONCE A DAY BY MOUTH DAILY 90 capsule 1   EPINEPHrine 0.3 mg/0.3 mL IJ SOAJ injection Inject 0.3 mg into the muscle as needed for anaphylaxis.     gabapentin  (NEURONTIN ) 300 MG capsule Take 300 mg by mouth 3 (three) times daily.     hydrochlorothiazide  (HYDRODIURIL ) 25 MG tablet TAKE 1 TABLET BY MOUTH EVERY DAY 90 tablet 0   levothyroxine   (SYNTHROID ) 112 MCG tablet Take 112 mcg by mouth daily before breakfast.     loratadine  (CLARITIN ) 10 MG tablet Take 10 mg by mouth daily.     losartan  (COZAAR ) 100 MG tablet Take 100 mg by mouth daily.     MAGNESIUM  PO Take 1 tablet by mouth daily as needed (constipation).     Naltrexone HCl, Pain, (NALTREX) 4.5 MG CAPS Take 4.5 mg by mouth as needed (pain).     Omega-3 Fatty Acids (FISH OIL PO) Take 1 capsule by mouth daily.     Prasterone, DHEA, (DHEA PO) Take 1 tablet by mouth daily as needed (when taking magnesium ).     No current facility-administered medications for this visit.    Allergies as of 03/24/2024 - Review Complete 02/29/2024  Allergen Reaction Noted   Codeine Nausea And Vomiting 01/11/2017   Doxycycline Nausea And Vomiting 04/14/2020   Sulfa antibiotics Other (See Comments) 01/11/2017   Ace inhibitors Swelling and Other (See Comments) 08/21/2020   Nitrofurantoin  03/14/2022   Pravastatin  sodium Other (See Comments) 11/22/2023   Rosuvastatin  calcium  Other (See Comments) 11/22/2023    Past Medical History:  Diagnosis Date   Anxiety    Arthritis    Spine   Asthma    CFS (chronic fatigue syndrome)    Coronary artery disease  Fibromyalgia    Hashimoto's disease    History of kidney stones    HTN (hypertension)    Hyperlipidemia    Hypothyroidism (acquired)    IDA (iron deficiency anemia)    Sleep apnea     Past Surgical History:  Procedure Laterality Date   COLONOSCOPY  2024   EAR CYST EXCISION Bilateral    thyroid  ablation     TONSILLECTOMY  1975   URETERAL STENT PLACEMENT     VAGINAL HYSTERECTOMY  2013    Review of Systems:    All systems reviewed and negative except where noted in HPI.    Physical Exam:  There were no vitals taken for this visit. No LMP recorded. Patient has had a hysterectomy.  General: Well-nourished, well-developed in no acute distress.  Lungs: Clear to auscultation bilaterally. Non-labored. Heart: Regular rate and  rhythm, no murmurs rubs or gallops.  Abdomen: Bowel sounds are normal; Abdomen is Soft; No hepatosplenomegaly, masses or hernias;  No Abdominal Tenderness; No guarding or rebound tenderness. Neuro: Alert and oriented x 3.  Grossly intact.  Psych: Alert and cooperative, normal mood and affect.   Imaging Studies: MYOCARDIAL PERFUSION IMAGING Result Date: 02/29/2024   The study is normal. The study is low risk.   No ST deviation was noted.   LV perfusion is normal. There is no evidence of ischemia. There is no evidence of infarction.   Left ventricular function is normal. Nuclear stress EF: 82%. The left ventricular ejection fraction is hyperdynamic (>65%). End diastolic cavity size is normal. End systolic cavity size is normal. No evidence of transient ischemic dilation (TID) noted.   Prior study available for comparison from 02/20/2018. There are changes compared to prior study which appear to be improved (no perfusion defects).    Labs: CBC    Component Value Date/Time   WBC 7.3 09/29/2023 1408   RBC 4.81 09/29/2023 1408   HGB 11.5 (L) 09/29/2023 1408   HCT 37.5 09/29/2023 1408   PLT 420 (H) 09/29/2023 1408   MCV 78.0 (L) 09/29/2023 1408   MCH 23.9 (L) 09/29/2023 1408   MCHC 30.7 09/29/2023 1408   RDW 15.9 (H) 09/29/2023 1408   LYMPHSABS 3.2 09/29/2023 1408   MONOABS 1.0 09/29/2023 1408   EOSABS 0.0 09/29/2023 1408   BASOSABS 0.0 09/29/2023 1408    CMP     Component Value Date/Time   NA 138 09/29/2023 1408   NA 140 12/27/2021 1035   K 2.8 (L) 09/29/2023 1408   CL 100 09/29/2023 1408   CO2 24 09/29/2023 1408   GLUCOSE 112 (H) 09/29/2023 1408   BUN 9 09/29/2023 1408   BUN 10 12/27/2021 1035   CREATININE 1.14 (H) 09/29/2023 1408   CALCIUM  10.2 09/29/2023 1408   PROT 7.6 09/29/2023 1408   ALBUMIN  3.9 09/29/2023 1408   AST 20 09/29/2023 1408   ALT 10 09/29/2023 1408   ALKPHOS 131 (H) 09/29/2023 1408   BILITOT 0.3 09/29/2023 1408   GFRNONAA 55 (L) 09/29/2023 1408        Assessment and Plan:   Sabrina Snyder is a 63 y.o. y/o female ***    Brigitte Canard, PA-C  Follow up ***

## 2024-03-25 DIAGNOSIS — M6281 Muscle weakness (generalized): Secondary | ICD-10-CM | POA: Diagnosis not present

## 2024-03-25 DIAGNOSIS — R2689 Other abnormalities of gait and mobility: Secondary | ICD-10-CM | POA: Diagnosis not present

## 2024-03-25 DIAGNOSIS — M2560 Stiffness of unspecified joint, not elsewhere classified: Secondary | ICD-10-CM | POA: Diagnosis not present

## 2024-03-25 DIAGNOSIS — M5489 Other dorsalgia: Secondary | ICD-10-CM | POA: Diagnosis not present

## 2024-03-28 DIAGNOSIS — R2689 Other abnormalities of gait and mobility: Secondary | ICD-10-CM | POA: Diagnosis not present

## 2024-03-28 DIAGNOSIS — M2560 Stiffness of unspecified joint, not elsewhere classified: Secondary | ICD-10-CM | POA: Diagnosis not present

## 2024-03-28 DIAGNOSIS — M5489 Other dorsalgia: Secondary | ICD-10-CM | POA: Diagnosis not present

## 2024-03-28 DIAGNOSIS — M6281 Muscle weakness (generalized): Secondary | ICD-10-CM | POA: Diagnosis not present

## 2024-04-01 DIAGNOSIS — R2689 Other abnormalities of gait and mobility: Secondary | ICD-10-CM | POA: Diagnosis not present

## 2024-04-01 DIAGNOSIS — M2560 Stiffness of unspecified joint, not elsewhere classified: Secondary | ICD-10-CM | POA: Diagnosis not present

## 2024-04-01 DIAGNOSIS — M5489 Other dorsalgia: Secondary | ICD-10-CM | POA: Diagnosis not present

## 2024-04-01 DIAGNOSIS — M6281 Muscle weakness (generalized): Secondary | ICD-10-CM | POA: Diagnosis not present

## 2024-04-03 DIAGNOSIS — R2689 Other abnormalities of gait and mobility: Secondary | ICD-10-CM | POA: Diagnosis not present

## 2024-04-03 DIAGNOSIS — M6281 Muscle weakness (generalized): Secondary | ICD-10-CM | POA: Diagnosis not present

## 2024-04-03 DIAGNOSIS — M5489 Other dorsalgia: Secondary | ICD-10-CM | POA: Diagnosis not present

## 2024-04-03 DIAGNOSIS — M2560 Stiffness of unspecified joint, not elsewhere classified: Secondary | ICD-10-CM | POA: Diagnosis not present

## 2024-04-08 DIAGNOSIS — M5489 Other dorsalgia: Secondary | ICD-10-CM | POA: Diagnosis not present

## 2024-04-08 DIAGNOSIS — M2560 Stiffness of unspecified joint, not elsewhere classified: Secondary | ICD-10-CM | POA: Diagnosis not present

## 2024-04-08 DIAGNOSIS — M6281 Muscle weakness (generalized): Secondary | ICD-10-CM | POA: Diagnosis not present

## 2024-04-08 DIAGNOSIS — R2689 Other abnormalities of gait and mobility: Secondary | ICD-10-CM | POA: Diagnosis not present

## 2024-04-10 DIAGNOSIS — R2689 Other abnormalities of gait and mobility: Secondary | ICD-10-CM | POA: Diagnosis not present

## 2024-04-10 DIAGNOSIS — M6281 Muscle weakness (generalized): Secondary | ICD-10-CM | POA: Diagnosis not present

## 2024-04-10 DIAGNOSIS — M2560 Stiffness of unspecified joint, not elsewhere classified: Secondary | ICD-10-CM | POA: Diagnosis not present

## 2024-04-10 DIAGNOSIS — M5489 Other dorsalgia: Secondary | ICD-10-CM | POA: Diagnosis not present

## 2024-04-15 DIAGNOSIS — M5489 Other dorsalgia: Secondary | ICD-10-CM | POA: Diagnosis not present

## 2024-04-15 DIAGNOSIS — R2689 Other abnormalities of gait and mobility: Secondary | ICD-10-CM | POA: Diagnosis not present

## 2024-04-15 DIAGNOSIS — M6281 Muscle weakness (generalized): Secondary | ICD-10-CM | POA: Diagnosis not present

## 2024-04-15 DIAGNOSIS — M2560 Stiffness of unspecified joint, not elsewhere classified: Secondary | ICD-10-CM | POA: Diagnosis not present

## 2024-04-17 DIAGNOSIS — M6281 Muscle weakness (generalized): Secondary | ICD-10-CM | POA: Diagnosis not present

## 2024-04-17 DIAGNOSIS — R2689 Other abnormalities of gait and mobility: Secondary | ICD-10-CM | POA: Diagnosis not present

## 2024-04-17 DIAGNOSIS — M5489 Other dorsalgia: Secondary | ICD-10-CM | POA: Diagnosis not present

## 2024-04-17 DIAGNOSIS — M2560 Stiffness of unspecified joint, not elsewhere classified: Secondary | ICD-10-CM | POA: Diagnosis not present

## 2024-04-18 DIAGNOSIS — E7800 Pure hypercholesterolemia, unspecified: Secondary | ICD-10-CM | POA: Diagnosis not present

## 2024-04-18 DIAGNOSIS — E039 Hypothyroidism, unspecified: Secondary | ICD-10-CM | POA: Diagnosis not present

## 2024-04-18 DIAGNOSIS — N1831 Chronic kidney disease, stage 3a: Secondary | ICD-10-CM | POA: Diagnosis not present

## 2024-04-18 DIAGNOSIS — G720 Drug-induced myopathy: Secondary | ICD-10-CM | POA: Diagnosis not present

## 2024-04-18 DIAGNOSIS — E611 Iron deficiency: Secondary | ICD-10-CM | POA: Diagnosis not present

## 2024-04-20 DIAGNOSIS — G4733 Obstructive sleep apnea (adult) (pediatric): Secondary | ICD-10-CM | POA: Diagnosis not present

## 2024-04-21 DIAGNOSIS — R2689 Other abnormalities of gait and mobility: Secondary | ICD-10-CM | POA: Diagnosis not present

## 2024-04-21 DIAGNOSIS — M5489 Other dorsalgia: Secondary | ICD-10-CM | POA: Diagnosis not present

## 2024-04-21 DIAGNOSIS — M6281 Muscle weakness (generalized): Secondary | ICD-10-CM | POA: Diagnosis not present

## 2024-04-21 DIAGNOSIS — M2560 Stiffness of unspecified joint, not elsewhere classified: Secondary | ICD-10-CM | POA: Diagnosis not present

## 2024-04-28 DIAGNOSIS — M5489 Other dorsalgia: Secondary | ICD-10-CM | POA: Diagnosis not present

## 2024-04-28 DIAGNOSIS — M2560 Stiffness of unspecified joint, not elsewhere classified: Secondary | ICD-10-CM | POA: Diagnosis not present

## 2024-04-28 DIAGNOSIS — R2689 Other abnormalities of gait and mobility: Secondary | ICD-10-CM | POA: Diagnosis not present

## 2024-04-28 DIAGNOSIS — M6281 Muscle weakness (generalized): Secondary | ICD-10-CM | POA: Diagnosis not present

## 2024-05-05 DIAGNOSIS — M5489 Other dorsalgia: Secondary | ICD-10-CM | POA: Diagnosis not present

## 2024-05-05 DIAGNOSIS — R2689 Other abnormalities of gait and mobility: Secondary | ICD-10-CM | POA: Diagnosis not present

## 2024-05-05 DIAGNOSIS — M6281 Muscle weakness (generalized): Secondary | ICD-10-CM | POA: Diagnosis not present

## 2024-05-05 DIAGNOSIS — M2560 Stiffness of unspecified joint, not elsewhere classified: Secondary | ICD-10-CM | POA: Diagnosis not present

## 2024-05-07 DIAGNOSIS — G4733 Obstructive sleep apnea (adult) (pediatric): Secondary | ICD-10-CM | POA: Diagnosis not present

## 2024-05-12 DIAGNOSIS — M6281 Muscle weakness (generalized): Secondary | ICD-10-CM | POA: Diagnosis not present

## 2024-05-12 DIAGNOSIS — M2560 Stiffness of unspecified joint, not elsewhere classified: Secondary | ICD-10-CM | POA: Diagnosis not present

## 2024-05-12 DIAGNOSIS — R2689 Other abnormalities of gait and mobility: Secondary | ICD-10-CM | POA: Diagnosis not present

## 2024-05-12 DIAGNOSIS — M5489 Other dorsalgia: Secondary | ICD-10-CM | POA: Diagnosis not present

## 2024-05-19 DIAGNOSIS — M6281 Muscle weakness (generalized): Secondary | ICD-10-CM | POA: Diagnosis not present

## 2024-05-19 DIAGNOSIS — R2689 Other abnormalities of gait and mobility: Secondary | ICD-10-CM | POA: Diagnosis not present

## 2024-05-19 DIAGNOSIS — M2560 Stiffness of unspecified joint, not elsewhere classified: Secondary | ICD-10-CM | POA: Diagnosis not present

## 2024-05-19 DIAGNOSIS — M5489 Other dorsalgia: Secondary | ICD-10-CM | POA: Diagnosis not present

## 2024-05-20 DIAGNOSIS — G4733 Obstructive sleep apnea (adult) (pediatric): Secondary | ICD-10-CM | POA: Diagnosis not present

## 2024-05-29 DIAGNOSIS — H906 Mixed conductive and sensorineural hearing loss, bilateral: Secondary | ICD-10-CM | POA: Diagnosis not present

## 2024-05-29 DIAGNOSIS — K027 Dental root caries: Secondary | ICD-10-CM | POA: Diagnosis not present

## 2024-05-29 DIAGNOSIS — K047 Periapical abscess without sinus: Secondary | ICD-10-CM | POA: Diagnosis not present

## 2024-05-29 DIAGNOSIS — H73001 Acute myringitis, right ear: Secondary | ICD-10-CM | POA: Diagnosis not present

## 2024-05-29 DIAGNOSIS — H6993 Unspecified Eustachian tube disorder, bilateral: Secondary | ICD-10-CM | POA: Diagnosis not present

## 2024-05-29 DIAGNOSIS — H8111 Benign paroxysmal vertigo, right ear: Secondary | ICD-10-CM | POA: Diagnosis not present

## 2024-05-29 DIAGNOSIS — Z9089 Acquired absence of other organs: Secondary | ICD-10-CM | POA: Diagnosis not present

## 2024-06-03 DIAGNOSIS — M2560 Stiffness of unspecified joint, not elsewhere classified: Secondary | ICD-10-CM | POA: Diagnosis not present

## 2024-06-03 DIAGNOSIS — M5489 Other dorsalgia: Secondary | ICD-10-CM | POA: Diagnosis not present

## 2024-06-03 DIAGNOSIS — M6281 Muscle weakness (generalized): Secondary | ICD-10-CM | POA: Diagnosis not present

## 2024-06-03 DIAGNOSIS — R2689 Other abnormalities of gait and mobility: Secondary | ICD-10-CM | POA: Diagnosis not present

## 2024-06-04 DIAGNOSIS — M4310 Spondylolisthesis, site unspecified: Secondary | ICD-10-CM | POA: Diagnosis not present

## 2024-06-06 DIAGNOSIS — N200 Calculus of kidney: Secondary | ICD-10-CM | POA: Diagnosis not present

## 2024-06-06 DIAGNOSIS — R1011 Right upper quadrant pain: Secondary | ICD-10-CM | POA: Diagnosis not present

## 2024-06-09 ENCOUNTER — Ambulatory Visit: Attending: Internal Medicine | Admitting: Internal Medicine

## 2024-06-09 VITALS — BP 114/80 | HR 95 | Ht 62.0 in | Wt 160.0 lb

## 2024-06-09 DIAGNOSIS — I700 Atherosclerosis of aorta: Secondary | ICD-10-CM | POA: Diagnosis not present

## 2024-06-09 DIAGNOSIS — I2510 Atherosclerotic heart disease of native coronary artery without angina pectoris: Secondary | ICD-10-CM | POA: Diagnosis not present

## 2024-06-09 DIAGNOSIS — E7800 Pure hypercholesterolemia, unspecified: Secondary | ICD-10-CM | POA: Diagnosis not present

## 2024-06-09 DIAGNOSIS — I10 Essential (primary) hypertension: Secondary | ICD-10-CM

## 2024-06-09 NOTE — Patient Instructions (Signed)
 Medication Instructions:  Your physician recommends that you continue on your current medications as directed. Please refer to the Current Medication list given to you today.  *If you need a refill on your cardiac medications before your next appointment, please call your pharmacy*  Lab Work: [facility] at [employer] [employer] 1st floor  If you have labs (blood work) drawn today and your tests are completely normal, you will receive your results only by: MyChart Message (if you have MyChart) OR A paper copy in the mail If you have any lab test that is abnormal or we need to change your treatment, we will call you to review the results.  Testing/Procedures: NONE  Follow-Up: At [facility], you and your health needs are our priority.  As part of our continuing mission to provide you with exceptional heart care, our providers are all part of one team.  This team includes your primary Cardiologist (physician) and Advanced Practice Providers or APPs (Physician Assistants and Nurse Practitioners) who all work together to provide you with the care you need, when you need it.  Your next appointment:   1 year(s)  Provider:   Stanly DELENA Leavens, MD or Orren Fabry, PA-C, Jackee Alberts, NP, Lum Louis, NP, or Katlyn West, NP

## 2024-06-09 NOTE — Progress Notes (Signed)
 Cardiology Office Note:  .    Date:  06/09/2024  ID:  Sabrina Snyder, DOB [birth year], MRN [id number] PCP: Regino Slater, MD  [facility] Providers Cardiologist:  Stanly DELENA Leavens, MD     CC: Follow up CAD  History of Present Illness: .    Sabrina Snyder is a 63 y.o. female with coronary artery disease who presents with chest pain.  Sabrina Snyder is a 63 year old female with coronary artery calcifications who presents for post stress test follow-up and evaluation of her symptoms.  She is here for a follow-up after a recent stress test, which was negative. She has a history of coronary artery calcifications and previously experienced chest discomfort. She has been working on her diet and has made significant changes, although her LDL was noted to be slightly elevated. Her blood pressure is well controlled, and her anemia has improved.  She underwent back surgery since her last visit, which has affected her legs, causing pain when walking. Despite this, she has been trying to maintain physical activity, mentioning that she was very active while caring for her granddaughter, which included activities like climbing slides and swinging. She wants to resume walking once her leg pain improves.  She has been adhering to her medication regimen, which includes taking iron with vitamin C, fish oil daily, and magnesium . She is also trying to eat more green vegetables and maintain a healthier lifestyle, influenced by her daughter's vegetarian diet.   Relevant histories: .  Social - established with me 2023 with CAC on CT ROS: As per HPI.   Studies Reviewed: .   Cardiac Studies & Procedures   ______________________________________________________________________________________________   STRESS TESTS  MYOCARDIAL PERFUSION IMAGING 02/29/2024  Interpretation Summary   The study is normal. The study is low risk.   No ST deviation was noted.   LV perfusion is normal. There  is no evidence of ischemia. There is no evidence of infarction.   Left ventricular function is normal. Nuclear stress EF: 82%. The left ventricular ejection fraction is hyperdynamic (>65%). End diastolic cavity size is normal. End systolic cavity size is normal. No evidence of transient ischemic dilation (TID) noted.   Prior study available for comparison from 02/20/2018. There are changes compared to prior study which appear to be improved (no perfusion defects).   ECHOCARDIOGRAM  ECHOCARDIOGRAM COMPLETE 01/03/2022  Narrative ECHOCARDIOGRAM REPORT    Patient Name:   Sabrina Snyder [facility] Date of Exam: 01/03/2022 Medical Rec #:  [id number]        Height:       62.0 in Accession #:    [id number]       Weight:       164.0 lb Date of Birth:  Jun 26, 1961        BSA:          1.757 m Patient Age:    60 years         BP:           135/97 mmHg Patient Gender: F                HR:           93 bpm. Exam Location:  [address]  Procedure: 2D Echo, Cardiac Doppler and Color Doppler  Indications:    R06.09 SOB  History:        Patient has no prior history of Echocardiogram examinations. Signs/Symptoms:Shortness of Breath; Risk Factors:Hypertension and Dyslipidemia.  Sonographer:  Elsie Bohr RDCS Referring Phys: [id number] [facility] A Jolonda Gomm  IMPRESSIONS   1. Left ventricular ejection fraction, by estimation, is 60 to 65%. The left ventricle has normal function. The left ventricle has no regional wall motion abnormalities. There is mild left ventricular hypertrophy. Left ventricular diastolic parameters were normal. 2. Right ventricular systolic function is normal. The right ventricular size is normal. 3. The mitral valve is normal in structure. Trivial mitral valve regurgitation. No evidence of mitral stenosis. 4. The aortic valve is tricuspid. Aortic valve regurgitation is not visualized. No aortic stenosis is present. 5. The inferior vena cava is normal in size with greater than 50%  respiratory variability, suggesting right atrial pressure of 3 mmHg.  FINDINGS Left Ventricle: Left ventricular ejection fraction, by estimation, is 60 to 65%. The left ventricle has normal function. The left ventricle has no regional wall motion abnormalities. The left ventricular internal cavity size was normal in size. There is mild left ventricular hypertrophy. Left ventricular diastolic parameters were normal.  Right Ventricle: The right ventricular size is normal. No increase in right ventricular wall thickness. Right ventricular systolic function is normal.  Left Atrium: Left atrial size was normal in size.  Right Atrium: Right atrial size was normal in size.  Pericardium: There is no evidence of pericardial effusion.  Mitral Valve: The mitral valve is normal in structure. Trivial mitral valve regurgitation. No evidence of mitral valve stenosis.  Tricuspid Valve: The tricuspid valve is normal in structure. Tricuspid valve regurgitation is trivial. No evidence of tricuspid stenosis.  Aortic Valve: The aortic valve is tricuspid. Aortic valve regurgitation is not visualized. No aortic stenosis is present.  Pulmonic Valve: The pulmonic valve was normal in structure. Pulmonic valve regurgitation is mild. No evidence of pulmonic stenosis.  Aorta: The aortic root is normal in size and structure.  Venous: The inferior vena cava is normal in size with greater than 50% respiratory variability, suggesting right atrial pressure of 3 mmHg.  IAS/Shunts: No atrial level shunt detected by color flow Doppler.   LEFT VENTRICLE PLAX 2D LVIDd:         3.80 cm   Diastology LVIDs:         2.60 cm   LV e' medial:    7.18 cm/s LV PW:         1.00 cm   LV E/e' medial:  12.0 LV IVS:        1.20 cm   LV e' lateral:   14.50 cm/s LVOT diam:     1.60 cm   LV E/e' lateral: 6.0 LV SV:         47 LV SV Index:   27 LVOT Area:     2.01 cm   RIGHT VENTRICLE             IVC RV S prime:     24.90 cm/s   IVC diam: 1.40 cm TAPSE (M-mode): 1.8 cm RVSP:           27.0 mmHg  LEFT ATRIUM             Index        RIGHT ATRIUM           Index LA diam:        3.30 cm 1.88 cm/m   RA Pressure: 3.00 mmHg LA Vol (A2C):   56.3 ml 32.04 ml/m  RA Area:     11.70 cm LA Vol (A4C):   45.4 ml 25.84 ml/m  RA Volume:  24.30 ml  13.83 ml/m LA Biplane Vol: 53.4 ml 30.39 ml/m AORTIC VALVE LVOT Vmax:   129.00 cm/s LVOT Vmean:  85.400 cm/s LVOT VTI:    0.232 m  AORTA Ao Root diam: 3.00 cm Ao Asc diam:  3.00 cm  MITRAL VALVE                TRICUSPID VALVE MV Area (PHT): 3.93 cm     TR Peak grad:   24.0 mmHg MV Decel Time: 193 msec     TR Vmax:        245.00 cm/s MV E velocity: 86.50 cm/s   Estimated RAP:  3.00 mmHg MV A velocity: 102.00 cm/s  RVSP:           27.0 mmHg MV E/A ratio:  0.85 SHUNTS Systemic VTI:  0.23 m Systemic Diam: 1.60 cm  Maude Emmer MD Electronically signed by Maude Emmer MD Signature Date/Time: 01/03/2022/4:39:45 PM    Final      CT SCANS  CT CORONARY MORPH W/CTA COR W/SCORE 01/31/2022  Addendum 01/31/2022  2:58 PM ADDENDUM REPORT: 01/31/2022 14:56  ADDENDUM: The following report is an over-read performed by radiologist Dr. Reyes Holder of [facility] on January 31, 2022. This over-read does not include interpretation of cardiac or coronary anatomy or pathology. The coronary calcium  score/coronary CTA interpretation by the cardiologist is attached.  COMPARISON:  CT October 29, 2020  FINDINGS: Vascular: No acute non-cardiac vascular finding.  Mediastinum/Nodes: No pathologically enlarged mediastinal, or hilar lymph nodes, noting limited sensitivity for the detection of hilar adenopathy on this noncontrast study. Visualized portions of the esophagus are grossly unremarkable  Lungs/Pleura: Within the visualized portions of the thorax there are no suspicious appearing pulmonary nodules or masses, there is no acute consolidative airspace  disease, no pleural effusions and no pneumothorax  Upper Abdomen: Visualized portions of the upper abdomen are unremarkable.  Musculoskeletal: There are no aggressive appearing lytic or blastic lesions noted in the visualized portions of the skeleton.  IMPRESSION: No significant incidental noncardiac finding noted.   Electronically Signed By: Reyes Holder M.D. On: 01/31/2022 14:56  Narrative CLINICAL DATA:  Chest pain  EXAM: Cardiac CTA  MEDICATIONS: Sub lingual nitro. 4mg  and lopressor  100mg   TECHNIQUE: The patient was scanned on a Siemens Force 192 slice scanner. Gantry rotation speed was 250 msecs. Collimation was .6 mm. A 100 kV prospective scan was triggered in the ascending thoracic aorta at 140 HU's Full mA was used between 35% and 75% of the R-R interval. Average HR during the scan was 65 bpm. The 3D data set was interpreted on a dedicated work station using MPR, MIP and VRT modes. A total of 80 cc of contrast was used.  FINDINGS: Non-cardiac: See separate report from [facility]. No significant findings on limited lung and soft tissue windows.  Calcium  Score: Noted only in proximal LAD  Coronary Arteries: Right dominant with no anomalies  LM: Normal  LAD: 1-24% mixed plaque in ostial/proximal LAD  D1: Normal  D2: Normal  Circumflex: Normal  OM1: Normal  OM2: Normal  RCA: Normal  PDA: Normal  PLA: Normal  IMPRESSION: 1. Calcium  score 14 isolated to LAD this is 50 th percentile for age / sex  2.  CAD RADS 1 non obstructive CAD in LAD see description above  3.  Normal ascending thoracic aorta 2.9 cm  Maude Emmer  Electronically Signed: By: Maude Emmer M.D. On: 01/31/2022 14:34     ______________________________________________________________________________________________  Physical Exam:    VS:  BP 114/80 (BP Location: Left Arm)   Pulse 95   Ht 5' 2 (1.575 m)   Wt 160 lb (72.6 kg)   SpO2 94%   BMI  29.26 kg/m    Wt Readings from Last 3 Encounters:  06/09/24 160 lb (72.6 kg)  02/08/24 158 lb 6.4 oz (71.8 kg)  11/22/23 161 lb 12.8 oz (73.4 kg)    Gen: no distress   Neck: No JVD Cardiac: No Rubs or Gallops, no murmur, RRR +2radial pulses Respiratory: Clear to auscultation bilaterally, normal  effort, normal  respiratory rate GI: Soft, nontender, non-distended  MS: No  edema;  moves all extremities Integument: Skin feels warm Neuro:  At time of evaluation, alert and oriented to person/place/time/situation  Psych: Normal affect, patient feels ok   ASSESSMENT AND PLAN: .    Coronary artery disease Aortic Atherosclerosis HLD Coronary artery disease with previous chest discomfort. Recent stress test negative for significant ischemia. Blood pressure well controlled. LDL cholesterol slightly elevated. No evidence of myocardial infarction based on stress test and perfusion studies. EKG findings of septal infarct not supported by perfusion studies, indicating no infarct. She has made dietary changes and is engaging in physical activity as tolerated. - Order LDL direct test to assess current cholesterol levels - Consider Nexlizet  for cholesterol management if LDL remains elevated - Continue current blood pressure management - Reassess blood pressure medication if pressures become low, potentially reducing or discontinuing amlodipine   Anemia Anemia has improved with current management. She is taking iron supplements with vitamin C, which has contributed to improved blood counts.  Hypertension - Episodes of dizziness potentially related to hypotension have resolved - continue current therapy  One year f/u with me or my team    Stanly Leavens, MD FASE [facility] Cardiologist [facility]  [address] [place], [address] [place], [place] [address] [phone]  2:59 PM

## 2024-06-10 ENCOUNTER — Ambulatory Visit: Payer: Self-pay | Admitting: Internal Medicine

## 2024-06-10 DIAGNOSIS — M4316 Spondylolisthesis, lumbar region: Secondary | ICD-10-CM | POA: Diagnosis not present

## 2024-06-10 LAB — LDL CHOLESTEROL, DIRECT: LDL Direct: 43 mg/dL (ref 0–99)

## 2024-06-13 DIAGNOSIS — M47816 Spondylosis without myelopathy or radiculopathy, lumbar region: Secondary | ICD-10-CM | POA: Diagnosis not present

## 2024-06-19 DIAGNOSIS — H906 Mixed conductive and sensorineural hearing loss, bilateral: Secondary | ICD-10-CM | POA: Diagnosis not present

## 2024-06-19 DIAGNOSIS — Z9089 Acquired absence of other organs: Secondary | ICD-10-CM | POA: Diagnosis not present

## 2024-06-19 DIAGNOSIS — H7311 Chronic myringitis, right ear: Secondary | ICD-10-CM | POA: Diagnosis not present

## 2024-06-19 DIAGNOSIS — H6993 Unspecified Eustachian tube disorder, bilateral: Secondary | ICD-10-CM | POA: Diagnosis not present

## 2024-06-20 DIAGNOSIS — R1031 Right lower quadrant pain: Secondary | ICD-10-CM | POA: Diagnosis not present

## 2024-06-20 DIAGNOSIS — Z87442 Personal history of urinary calculi: Secondary | ICD-10-CM | POA: Diagnosis not present

## 2024-06-24 DIAGNOSIS — Z87442 Personal history of urinary calculi: Secondary | ICD-10-CM | POA: Diagnosis not present

## 2024-06-24 DIAGNOSIS — N200 Calculus of kidney: Secondary | ICD-10-CM | POA: Diagnosis not present

## 2024-06-25 DIAGNOSIS — Z87442 Personal history of urinary calculi: Secondary | ICD-10-CM | POA: Diagnosis not present

## 2024-06-25 DIAGNOSIS — N200 Calculus of kidney: Secondary | ICD-10-CM | POA: Diagnosis not present

## 2024-06-25 DIAGNOSIS — R109 Unspecified abdominal pain: Secondary | ICD-10-CM | POA: Diagnosis not present

## 2024-07-03 DIAGNOSIS — M4310 Spondylolisthesis, site unspecified: Secondary | ICD-10-CM | POA: Diagnosis not present

## 2024-07-04 ENCOUNTER — Encounter: Payer: Self-pay | Admitting: Family Medicine

## 2024-07-15 ENCOUNTER — Telehealth: Payer: Self-pay | Admitting: Pharmacy Technician

## 2024-07-15 NOTE — Telephone Encounter (Signed)
   Pharmacy Patient Advocate Encounter   Received notification from Onbase that prior authorization for nexlizet  is required/requested.   Insurance verification completed.   The patient is insured through [facility] .   Per test claim: PA required; PA submitted to above mentioned insurance via Latent Key/confirmation #/EOC [id number] Status is pending

## 2024-07-15 NOTE — Telephone Encounter (Signed)
 Pharmacy Patient Advocate Encounter  Received notification from [employer] that Prior Authorization for nexlizet  has been APPROVED from 07/15/24 to 11/12/24   PA #/Case ID/Reference #: [id number]

## 2024-07-20 DIAGNOSIS — L02412 Cutaneous abscess of left axilla: Secondary | ICD-10-CM | POA: Diagnosis not present

## 2024-07-21 DIAGNOSIS — M5416 Radiculopathy, lumbar region: Secondary | ICD-10-CM | POA: Diagnosis not present

## 2024-08-04 DIAGNOSIS — Z23 Encounter for immunization: Secondary | ICD-10-CM | POA: Diagnosis not present

## 2024-08-04 DIAGNOSIS — N6452 Nipple discharge: Secondary | ICD-10-CM | POA: Diagnosis not present

## 2024-08-07 DIAGNOSIS — M5416 Radiculopathy, lumbar region: Secondary | ICD-10-CM | POA: Diagnosis not present

## 2024-08-08 DIAGNOSIS — B079 Viral wart, unspecified: Secondary | ICD-10-CM | POA: Diagnosis not present

## 2024-08-08 DIAGNOSIS — L820 Inflamed seborrheic keratosis: Secondary | ICD-10-CM | POA: Diagnosis not present

## 2024-08-08 DIAGNOSIS — M71349 Other bursal cyst, unspecified hand: Secondary | ICD-10-CM | POA: Diagnosis not present

## 2024-08-08 DIAGNOSIS — D485 Neoplasm of uncertain behavior of skin: Secondary | ICD-10-CM | POA: Diagnosis not present

## 2024-08-08 DIAGNOSIS — D2239 Melanocytic nevi of other parts of face: Secondary | ICD-10-CM | POA: Diagnosis not present

## 2024-08-08 DIAGNOSIS — D225 Melanocytic nevi of trunk: Secondary | ICD-10-CM | POA: Diagnosis not present

## 2024-08-14 DIAGNOSIS — M5416 Radiculopathy, lumbar region: Secondary | ICD-10-CM | POA: Diagnosis not present

## 2024-08-14 DIAGNOSIS — M546 Pain in thoracic spine: Secondary | ICD-10-CM | POA: Diagnosis not present

## 2024-08-14 DIAGNOSIS — M961 Postlaminectomy syndrome, not elsewhere classified: Secondary | ICD-10-CM | POA: Diagnosis not present

## 2024-08-15 ENCOUNTER — Other Ambulatory Visit: Payer: Self-pay | Admitting: Neurosurgery

## 2024-08-15 DIAGNOSIS — M546 Pain in thoracic spine: Secondary | ICD-10-CM

## 2024-08-16 DIAGNOSIS — N3001 Acute cystitis with hematuria: Secondary | ICD-10-CM | POA: Diagnosis not present

## 2024-08-16 DIAGNOSIS — N3091 Cystitis, unspecified with hematuria: Secondary | ICD-10-CM | POA: Diagnosis not present

## 2024-08-25 ENCOUNTER — Other Ambulatory Visit

## 2024-08-28 DIAGNOSIS — J4520 Mild intermittent asthma, uncomplicated: Secondary | ICD-10-CM | POA: Diagnosis not present

## 2024-08-28 DIAGNOSIS — M85852 Other specified disorders of bone density and structure, left thigh: Secondary | ICD-10-CM | POA: Diagnosis not present

## 2024-08-28 DIAGNOSIS — M85851 Other specified disorders of bone density and structure, right thigh: Secondary | ICD-10-CM | POA: Diagnosis not present

## 2024-08-28 DIAGNOSIS — R5383 Other fatigue: Secondary | ICD-10-CM | POA: Diagnosis not present

## 2024-08-28 DIAGNOSIS — R400 Somnolence: Secondary | ICD-10-CM | POA: Diagnosis not present

## 2024-08-28 DIAGNOSIS — G4733 Obstructive sleep apnea (adult) (pediatric): Secondary | ICD-10-CM | POA: Diagnosis not present

## 2024-08-29 DIAGNOSIS — D485 Neoplasm of uncertain behavior of skin: Secondary | ICD-10-CM | POA: Diagnosis not present

## 2024-09-06 ENCOUNTER — Ambulatory Visit
Admission: RE | Admit: 2024-09-06 | Discharge: 2024-09-06 | Disposition: A | Source: Ambulatory Visit | Attending: Neurosurgery | Admitting: Neurosurgery

## 2024-09-06 DIAGNOSIS — M5124 Other intervertebral disc displacement, thoracic region: Secondary | ICD-10-CM | POA: Diagnosis not present

## 2024-09-06 DIAGNOSIS — M48061 Spinal stenosis, lumbar region without neurogenic claudication: Secondary | ICD-10-CM | POA: Diagnosis not present

## 2024-09-06 DIAGNOSIS — M546 Pain in thoracic spine: Secondary | ICD-10-CM

## 2024-10-03 ENCOUNTER — Other Ambulatory Visit: Payer: Self-pay | Admitting: Neurosurgery

## 2024-10-03 DIAGNOSIS — G992 Myelopathy in diseases classified elsewhere: Secondary | ICD-10-CM

## 2024-10-13 ENCOUNTER — Telehealth: Payer: Self-pay | Admitting: Internal Medicine

## 2024-10-13 NOTE — Telephone Encounter (Signed)
 Spoke with patient of Dr. Santo. Per chart review, she was to take amlodipine  5mg  daily as of 01/2024 visit. Dizziness has resolved.   She reports BP started to increase at home so she started to take 10mg  x several weeks. Her BP at home is now 110s-120s/70s.   She would like Rx changed. Since this is a change from med list/Rx -- will defer to MD.

## 2024-10-13 NOTE — Telephone Encounter (Signed)
 Pt c/o medication issue:  1. Name of Medication:   amLODipine  (NORVASC ) 5 MG tablet    2. How are you currently taking this medication (dosage and times per day)? 10mg  daily   3. Are you having a reaction (difficulty breathing--STAT)? No   4. What is your medication issue? Pt states she has been taking 10mg  tablets once daily. She asked if rx can be changed and sent over to pharmacy .    [id number] - [place], [place] - [address]

## 2024-10-14 MED ORDER — AMLODIPINE BESYLATE 10 MG PO TABS: 10.0000 mg | ORAL_TABLET | Freq: Every day | ORAL | 3 refills | Status: AC

## 2024-10-14 NOTE — Telephone Encounter (Signed)
 Spoke with pt and advised per Dr Santo he was glad that dose worked for her.  New Rx of Amlodipine  10mg  - 1 tablet by  mouth daily sent to pharmacy as requested by pt.

## 2024-10-23 ENCOUNTER — Other Ambulatory Visit: Payer: Self-pay | Admitting: Neurosurgery

## 2024-10-23 DIAGNOSIS — M4804 Spinal stenosis, thoracic region: Secondary | ICD-10-CM

## 2024-10-27 ENCOUNTER — Other Ambulatory Visit

## 2024-11-04 ENCOUNTER — Other Ambulatory Visit

## 2024-11-07 NOTE — Discharge Instructions (Signed)

## 2024-11-10 ENCOUNTER — Telehealth: Payer: Self-pay | Admitting: Pharmacy Technician

## 2024-11-10 ENCOUNTER — Ambulatory Visit
Admission: RE | Admit: 2024-11-10 | Discharge: 2024-11-10 | Disposition: A | Source: Ambulatory Visit | Attending: Neurosurgery | Admitting: Neurosurgery

## 2024-11-10 DIAGNOSIS — M4804 Spinal stenosis, thoracic region: Secondary | ICD-10-CM

## 2024-11-10 MED ORDER — ONDANSETRON HCL 4 MG/2ML IJ SOLN: 4.0000 mg | Freq: Once | INTRAMUSCULAR | Status: DC | PRN

## 2024-11-10 MED ORDER — MEPERIDINE HCL 50 MG/ML IJ SOLN: 50.0000 mg | Freq: Once | INTRAMUSCULAR | Status: DC | PRN

## 2024-11-10 MED ORDER — IOPAMIDOL (ISOVUE-M 300) INJECTION 61%
10.0000 mL | Freq: Once | INTRAMUSCULAR | Status: AC
  Administered 2024-11-10: 10 mL via INTRATHECAL

## 2024-11-10 MED ORDER — DIAZEPAM 5 MG PO TABS: 10.0000 mg | ORAL_TABLET | Freq: Once | ORAL | Status: DC

## 2024-11-10 NOTE — Telephone Encounter (Signed)
 Pharmacy Patient Advocate Encounter   Received notification from [employer]eds that prior authorization for nexlizet  is required/requested.   Insurance verification completed.   The patient is insured through [place] [employer].   Per test claim: PA required; PA started via CoverMyMeds. KEY [id number] . Please see clinical question(s) below that I am not finding the answer to in their chart and advise.

## 2024-11-10 NOTE — Telephone Encounter (Signed)
 Pharmacy Patient Advocate Encounter  Received notification from [facility] that Prior Authorization for nexlizet  has been APPROVED from 11/10/24 to 11/12/25   PA #/Case ID/Reference #: [id number]

## 2024-11-28 ENCOUNTER — Other Ambulatory Visit: Payer: Self-pay | Admitting: Neurosurgery

## 2024-12-16 ENCOUNTER — Encounter (HOSPITAL_COMMUNITY): Payer: Self-pay

## 2024-12-16 ENCOUNTER — Inpatient Hospital Stay (HOSPITAL_COMMUNITY)
Admission: RE | Admit: 2024-12-16 | Discharge: 2024-12-16 | Disposition: A | Source: Ambulatory Visit | Attending: Neurosurgery

## 2024-12-16 ENCOUNTER — Other Ambulatory Visit: Payer: Self-pay

## 2024-12-16 VITALS — BP 122/66 | HR 97 | Temp 98.5°F | Resp 17 | Ht 62.0 in | Wt 172.6 lb

## 2024-12-16 DIAGNOSIS — I119 Hypertensive heart disease without heart failure: Secondary | ICD-10-CM | POA: Insufficient documentation

## 2024-12-16 DIAGNOSIS — Z01818 Encounter for other preprocedural examination: Secondary | ICD-10-CM

## 2024-12-16 DIAGNOSIS — I2510 Atherosclerotic heart disease of native coronary artery without angina pectoris: Secondary | ICD-10-CM | POA: Insufficient documentation

## 2024-12-16 HISTORY — DX: Family history of other specified conditions: Z84.89

## 2024-12-16 LAB — SURGICAL PCR SCREEN
MRSA, PCR: NEGATIVE
Staphylococcus aureus: NEGATIVE

## 2024-12-16 LAB — CBC
HCT: 41.6 % (ref 36.0–46.0)
Hemoglobin: 14.8 g/dL (ref 12.0–15.0)
MCH: 33.9 pg (ref 26.0–34.0)
MCHC: 35.6 g/dL (ref 30.0–36.0)
MCV: 95.2 fL (ref 80.0–100.0)
Platelets: 254 10*3/uL (ref 150–400)
RBC: 4.37 MIL/uL (ref 3.87–5.11)
RDW: 13 % (ref 11.5–15.5)
WBC: 5.2 10*3/uL (ref 4.0–10.5)
nRBC: 0 % (ref 0.0–0.2)

## 2024-12-16 LAB — BASIC METABOLIC PANEL WITH GFR
Anion gap: 10 (ref 5–15)
BUN: 13 mg/dL (ref 8–23)
CO2: 29 mmol/L (ref 22–32)
Calcium: 9.8 mg/dL (ref 8.9–10.3)
Chloride: 103 mmol/L (ref 98–111)
Creatinine, Ser: 0.96 mg/dL (ref 0.44–1.00)
GFR, Estimated: >60 mL/min
Glucose, Bld: 92 mg/dL (ref 70–99)
Potassium: 3.6 mmol/L (ref 3.5–5.1)
Sodium: 142 mmol/L (ref 135–145)

## 2024-12-16 NOTE — Progress Notes (Addendum)
 PCP - Dibas Koirala,MD Cardiologist - Mahesh Chandrasekar,MD  PPM/ICD - denies Device Orders -  Rep Notified -   Chest x-ray - na EKG - 12/16/24 Stress Test - 02/29/24 ECHO - 01/03/22 Cardiac Cath - denies  Sleep Study - +OSA CPAP - yes-uses it nightly  Fasting Blood Sugar - na Checks Blood Sugar _____ times a day  Last dose of GLP1 agonist-  na GLP1 instructions: na  Blood Thinner Instructions:na Aspirin  Instructions:na  ERAS Protcol -NPO PRE-SURGERY Ensure or G2-   COVID TEST- na   Anesthesia review: Yes- hx CAD,aortic atherosclerosis,chest pain-negative stress test, HTN,HLD,difficult airway.   Patient denies shortness of breath, fever, cough and chest pain at PAT appointment   All instructions explained to the patient, with a verbal understanding of the material. Patient agrees to go over the instructions while at home for a better understanding. The opportunity to ask questions was provided.

## 2024-12-17 NOTE — Anesthesia Preprocedure Evaluation (Addendum)
 "                                  Anesthesia Evaluation  Patient identified by MRN, date of birth, ID band Patient awake    Reviewed: Allergy & Precautions, H&P , NPO status , Patient's Chart, lab work & pertinent test results  Airway Mallampati: IV  TM Distance: >3 FB Neck ROM: Full  Mouth opening: Limited Mouth Opening  Dental no notable dental hx. (+) Teeth Intact, Dental Advisory Given   Pulmonary asthma , sleep apnea and Continuous Positive Airway Pressure Ventilation    Pulmonary exam normal breath sounds clear to auscultation       Cardiovascular Exercise Tolerance: Good hypertension, Pt. on medications + CAD   Rhythm:Regular Rate:Normal     Neuro/Psych   Anxiety     negative neurological ROS     GI/Hepatic negative GI ROS, Neg liver ROS,,,  Endo/Other  Hypothyroidism    Renal/GU negative Renal ROS  negative genitourinary   Musculoskeletal  (+) Arthritis ,  Fibromyalgia -  Abdominal   Peds  Hematology  (+) Blood dyscrasia, anemia   Anesthesia Other Findings   Reproductive/Obstetrics negative OB ROS                              Anesthesia Physical Anesthesia Plan  ASA: 3  Anesthesia Plan: General   Post-op Pain Management: Tylenol  PO (pre-op)*   Induction: Intravenous  PONV Risk Score and Plan: 4 or greater and Ondansetron , Dexamethasone , Midazolam  and Scopolamine  patch - Pre-op  Airway Management Planned: Oral ETT and Video Laryngoscope Planned  Additional Equipment:   Intra-op Plan:   Post-operative Plan: Extubation in OR  Informed Consent: I have reviewed the patients History and Physical, chart, labs and discussed the procedure including the risks, benefits and alternatives for the proposed anesthesia with the patient or authorized representative who has indicated his/her understanding and acceptance.     Dental advisory given  Plan Discussed with: CRNA  Anesthesia Plan Comments: (PAT note  by Lynwood Hope, PA-C: 64 year old female follows with cardiology for history of HTN, HLD, nonobstructive CAD.  She has had multiple benign evaluations of chest pain.  Nuclear stress test 02/2018 without ischemia or infarct.  Echo 12/2021 showed LVEF 60 to 65%, normal RV, no significant valvular abnormalities.  Coronary CTA 01/2022 showed calcium  score of 14 isolated to the LAD with nonobstructive CAD.  Nuclear stress 02/2024 nonischemic, low risk.  Last seen by Dr. Santo 06/09/2024, stable from cardiac standpoint, no changes to management, 1 year follow-up recommended.  Per anesthesia intubation note 09/03/2023, difficult intubation secondary to limited oral opening.  2 attempts with Mac 3.  Mask ventilation without difficulty.  Other pertinent history includes chronic fatigue syndrome, hypothyroidism, fibromyalgia, asthma, OSA on CPAP, anxiety.  Preop labs reviewed, WNL.  EKG 12/16/2024: NSR.  Rate 77.  Low-voltage QRS.  Septal infarct, age undetermined.  Nuclear stress 02/29/2024:   The study is normal. The study is low risk.   No ST deviation was noted.   LV perfusion is normal. There is no evidence of ischemia. There is no evidence of infarction.   Left ventricular function is normal. Nuclear stress EF: 82%. The left ventricular ejection fraction is hyperdynamic (>65%). End diastolic cavity size is normal. End systolic cavity size is normal. No evidence of transient ischemic dilation (TID) noted.   Prior  study available for comparison from 02/20/2018. There are changes compared to prior study which appear to be improved (no perfusion defects).  Coronary CTA 01/31/2022: IMPRESSION: 1. Calcium  score 14 isolated to LAD this is 69 th percentile for age / sex  2.  CAD RADS 1 non obstructive CAD in LAD see description above  3.  Normal ascending thoracic aorta 2.9 cm  TTE 01/03/2022: 1. Left ventricular ejection fraction, by estimation, is 60 to 65%. The  left ventricle has normal  function. The left ventricle has no regional  wall motion abnormalities. There is mild left ventricular hypertrophy.  Left ventricular diastolic parameters  were normal.  2. Right ventricular systolic function is normal. The right ventricular  size is normal.  3. The mitral valve is normal in structure. Trivial mitral valve  regurgitation. No evidence of mitral stenosis.  4. The aortic valve is tricuspid. Aortic valve regurgitation is not  visualized. No aortic stenosis is present.  5. The inferior vena cava is normal in size with greater than 50%  respiratory variability, suggesting right atrial pressure of 3 mmHg.   Nuclear stress 02/20/2018:  Nuclear stress EF: 75%.  Blood pressure demonstrated a hypertensive response to exercise.  There was no ST segment deviation noted during stress.  No T wave inversion was noted during stress.  Defect 1: There is a small defect of mild severity present in the mid anterolateral and apical lateral location. This is worse at rest than stress, consistent with artifact.  This is a low risk study.  The left ventricular ejection fraction is hyperdynamic (>65%).   )         Anesthesia Quick Evaluation  "

## 2024-12-17 NOTE — Progress Notes (Signed)
 Anesthesia Chart Review:  64 year old female follows with cardiology for history of HTN, HLD, nonobstructive CAD.  She has had multiple benign evaluations of chest pain.  Nuclear stress test 02/2018 without ischemia or infarct.  Echo 12/2021 showed LVEF 60 to 65%, normal RV, no significant valvular abnormalities.  Coronary CTA 01/2022 showed calcium  score of 14 isolated to the LAD with nonobstructive CAD.  Nuclear stress 02/2024 nonischemic, low risk.  Last seen by Dr. Santo 06/09/2024, stable from cardiac standpoint, no changes to management, 1 year follow-up recommended.  Per anesthesia intubation note 09/03/2023, difficult intubation secondary to limited oral opening.  2 attempts with Mac 3.  Mask ventilation without difficulty.  Other pertinent history includes chronic fatigue syndrome, hypothyroidism, fibromyalgia, asthma, OSA on CPAP, anxiety.  Preop labs reviewed, WNL.  EKG 12/16/2024: NSR.  Rate 77.  Low-voltage QRS.  Septal infarct, age undetermined.  Nuclear stress 02/29/2024:   The study is normal. The study is low risk.   No ST deviation was noted.   LV perfusion is normal. There is no evidence of ischemia. There is no evidence of infarction.   Left ventricular function is normal. Nuclear stress EF: 82%. The left ventricular ejection fraction is hyperdynamic (>65%). End diastolic cavity size is normal. End systolic cavity size is normal. No evidence of transient ischemic dilation (TID) noted.   Prior study available for comparison from 02/20/2018. There are changes compared to prior study which appear to be improved (no perfusion defects).  Coronary CTA 01/31/2022: IMPRESSION: 1. Calcium  score 14 isolated to LAD this is 55 th percentile for age / sex   2.  CAD RADS 1 non obstructive CAD in LAD see description above   3.  Normal ascending thoracic aorta 2.9 cm  TTE 01/03/2022:  1. Left ventricular ejection fraction, by estimation, is 60 to 65%. The  left ventricle has normal  function. The left ventricle has no regional  wall motion abnormalities. There is mild left ventricular hypertrophy.  Left ventricular diastolic parameters  were normal.   2. Right ventricular systolic function is normal. The right ventricular  size is normal.   3. The mitral valve is normal in structure. Trivial mitral valve  regurgitation. No evidence of mitral stenosis.   4. The aortic valve is tricuspid. Aortic valve regurgitation is not  visualized. No aortic stenosis is present.   5. The inferior vena cava is normal in size with greater than 50%  respiratory variability, suggesting right atrial pressure of 3 mmHg.   Nuclear stress 02/20/2018: Nuclear stress EF: 75%. Blood pressure demonstrated a hypertensive response to exercise. There was no ST segment deviation noted during stress. No T wave inversion was noted during stress. Defect 1: There is a small defect of mild severity present in the mid anterolateral and apical lateral location. This is worse at rest than stress, consistent with artifact. This is a low risk study. The left ventricular ejection fraction is hyperdynamic (>65%).     [name] [name] Short Stay Center/Anesthesiology Phone [phone] 12/17/2024 10:25 AM

## 2024-12-17 NOTE — H&P (View-Only) (Signed)
 Anesthesia Chart Review:  64 year old female follows with cardiology for history of HTN, HLD, nonobstructive CAD.  She has had multiple benign evaluations of chest pain.  Nuclear stress test 02/2018 without ischemia or infarct.  Echo 12/2021 showed LVEF 60 to 65%, normal RV, no significant valvular abnormalities.  Coronary CTA 01/2022 showed calcium  score of 14 isolated to the LAD with nonobstructive CAD.  Nuclear stress 02/2024 nonischemic, low risk.  Last seen by Dr. Santo 06/09/2024, stable from cardiac standpoint, no changes to management, 1 year follow-up recommended.  Per anesthesia intubation note 09/03/2023, difficult intubation secondary to limited oral opening.  2 attempts with Mac 3.  Mask ventilation without difficulty.  Other pertinent history includes chronic fatigue syndrome, hypothyroidism, fibromyalgia, asthma, OSA on CPAP, anxiety.  Preop labs reviewed, WNL.  EKG 12/16/2024: NSR.  Rate 77.  Low-voltage QRS.  Septal infarct, age undetermined.  Nuclear stress 02/29/2024:   The study is normal. The study is low risk.   No ST deviation was noted.   LV perfusion is normal. There is no evidence of ischemia. There is no evidence of infarction.   Left ventricular function is normal. Nuclear stress EF: 82%. The left ventricular ejection fraction is hyperdynamic (>65%). End diastolic cavity size is normal. End systolic cavity size is normal. No evidence of transient ischemic dilation (TID) noted.   Prior study available for comparison from 02/20/2018. There are changes compared to prior study which appear to be improved (no perfusion defects).  Coronary CTA 01/31/2022: IMPRESSION: 1. Calcium  score 14 isolated to LAD this is 55 th percentile for age / sex   2.  CAD RADS 1 non obstructive CAD in LAD see description above   3.  Normal ascending thoracic aorta 2.9 cm  TTE 01/03/2022:  1. Left ventricular ejection fraction, by estimation, is 60 to 65%. The  left ventricle has normal  function. The left ventricle has no regional  wall motion abnormalities. There is mild left ventricular hypertrophy.  Left ventricular diastolic parameters  were normal.   2. Right ventricular systolic function is normal. The right ventricular  size is normal.   3. The mitral valve is normal in structure. Trivial mitral valve  regurgitation. No evidence of mitral stenosis.   4. The aortic valve is tricuspid. Aortic valve regurgitation is not  visualized. No aortic stenosis is present.   5. The inferior vena cava is normal in size with greater than 50%  respiratory variability, suggesting right atrial pressure of 3 mmHg.   Nuclear stress 02/20/2018: Nuclear stress EF: 75%. Blood pressure demonstrated a hypertensive response to exercise. There was no ST segment deviation noted during stress. No T wave inversion was noted during stress. Defect 1: There is a small defect of mild severity present in the mid anterolateral and apical lateral location. This is worse at rest than stress, consistent with artifact. This is a low risk study. The left ventricular ejection fraction is hyperdynamic (>65%).     [name] [name] Short Stay Center/Anesthesiology Phone [phone] 12/17/2024 10:25 AM

## 2024-12-18 ENCOUNTER — Ambulatory Visit (HOSPITAL_COMMUNITY)

## 2024-12-18 ENCOUNTER — Encounter (HOSPITAL_COMMUNITY): Admitting: Anesthesiology

## 2024-12-18 ENCOUNTER — Encounter (HOSPITAL_COMMUNITY): Payer: Self-pay | Admitting: Neurosurgery

## 2024-12-18 ENCOUNTER — Encounter (HOSPITAL_COMMUNITY)
Admission: RE | Disposition: A | Discharge status: Home / Self Care | Payer: Self-pay | Source: Home / Self Care | Attending: Neurosurgery

## 2024-12-18 ENCOUNTER — Observation Stay (HOSPITAL_COMMUNITY)
Admission: RE | Admit: 2024-12-18 | Discharge: 2024-12-18 | Disposition: A | Discharge status: Home / Self Care | Source: Home / Self Care | Attending: Neurosurgery | Admitting: Neurosurgery

## 2024-12-18 ENCOUNTER — Encounter (HOSPITAL_COMMUNITY): Admitting: Physician Assistant

## 2024-12-18 DIAGNOSIS — M5104 Intervertebral disc disorders with myelopathy, thoracic region: Principal | ICD-10-CM | POA: Diagnosis present

## 2024-12-18 MED ORDER — SCOPOLAMINE 1 MG/3DAYS TD PT72: 1.0000 | MEDICATED_PATCH | TRANSDERMAL | Status: DC

## 2024-12-18 MED ORDER — PHENYLEPHRINE 80 MCG/ML (10ML) SYRINGE FOR IV PUSH (FOR BLOOD PRESSURE SUPPORT)
PREFILLED_SYRINGE | INTRAVENOUS | Status: AC
  Filled 2024-12-18: qty 10

## 2024-12-18 MED ORDER — HYDROMORPHONE HCL 1 MG/ML IJ SOLN
INTRAMUSCULAR | Status: DC | PRN
  Administered 2024-12-18: .25 mg via INTRAVENOUS

## 2024-12-18 MED ORDER — EZETIMIBE 10 MG PO TABS: 10.0000 mg | ORAL_TABLET | Freq: Every day | ORAL | Status: DC

## 2024-12-18 MED ORDER — THROMBIN 20000 UNITS EX SOLR
CUTANEOUS | Status: AC
  Filled 2024-12-18: qty 20000

## 2024-12-18 MED ORDER — ARTIFICIAL TEARS OPHTHALMIC OINT
TOPICAL_OINTMENT | OPHTHALMIC | Status: DC | PRN
  Administered 2024-12-18: 1 via OPHTHALMIC

## 2024-12-18 MED ORDER — ALPRAZOLAM 0.5 MG PO TABS: 0.5000 mg | ORAL_TABLET | Freq: Two times a day (BID) | ORAL | Status: DC | PRN

## 2024-12-18 MED ORDER — BUPIVACAINE HCL (PF) 0.25 % IJ SOLN
INTRAMUSCULAR | Status: AC
  Filled 2024-12-18: qty 30

## 2024-12-18 MED ORDER — 0.9 % SODIUM CHLORIDE (POUR BTL) OPTIME
TOPICAL | Status: DC | PRN
  Administered 2024-12-18: 1000 mL

## 2024-12-18 MED ORDER — ROCURONIUM BROMIDE 10 MG/ML (PF) SYRINGE
PREFILLED_SYRINGE | INTRAVENOUS | Status: DC | PRN
  Administered 2024-12-18: 10 mg via INTRAVENOUS
  Administered 2024-12-18: 50 mg via INTRAVENOUS

## 2024-12-18 MED ORDER — LACTATED RINGERS IV SOLN: INTRAVENOUS | Status: DC

## 2024-12-18 MED ORDER — PRAMIPEXOLE DIHYDROCHLORIDE 0.25 MG PO TABS: 0.7500 mg | ORAL_TABLET | Freq: Every evening | ORAL | Status: DC | PRN

## 2024-12-18 MED ORDER — ONDANSETRON HCL 4 MG PO TABS: 4.0000 mg | ORAL_TABLET | Freq: Four times a day (QID) | ORAL | Status: DC | PRN

## 2024-12-18 MED ORDER — MIDAZOLAM HCL (PF) 2 MG/2ML IJ SOLN
INTRAMUSCULAR | Status: DC | PRN
  Administered 2024-12-18 (×2): 1 mg via INTRAVENOUS

## 2024-12-18 MED ORDER — ACETAMINOPHEN 650 MG RE SUPP: 650.0000 mg | RECTAL | Status: DC | PRN

## 2024-12-18 MED ORDER — SODIUM CHLORIDE 0.9 % IV SOLN: 250.0000 mL | INTRAVENOUS | Status: DC

## 2024-12-18 MED ORDER — KETOROLAC TROMETHAMINE 30 MG/ML IJ SOLN
INTRAMUSCULAR | Status: AC
  Filled 2024-12-18: qty 1

## 2024-12-18 MED ORDER — CHLORHEXIDINE GLUCONATE CLOTH 2 % EX PADS: 6.0000 | MEDICATED_PAD | Freq: Once | CUTANEOUS | Status: DC

## 2024-12-18 MED ORDER — BACLOFEN 5 MG HALF TABLET: 5.0000 mg | ORAL_TABLET | Freq: Every day | ORAL | Status: DC | PRN

## 2024-12-18 MED ORDER — BUPIVACAINE HCL (PF) 0.25 % IJ SOLN
INTRAMUSCULAR | Status: DC | PRN
  Administered 2024-12-18: 20 mL

## 2024-12-18 MED ORDER — ROCURONIUM BROMIDE 10 MG/ML (PF) SYRINGE
PREFILLED_SYRINGE | INTRAVENOUS | Status: AC
  Filled 2024-12-18: qty 10

## 2024-12-18 MED ORDER — ONDANSETRON HCL 4 MG/2ML IJ SOLN: 4.0000 mg | Freq: Four times a day (QID) | INTRAMUSCULAR | Status: DC | PRN

## 2024-12-18 MED ORDER — CYANOCOBALAMIN 1000 MCG/ML IJ SOLN
1000.0000 ug | INTRAMUSCULAR | Status: DC
  Filled 2024-12-18: qty 1

## 2024-12-18 MED ORDER — HYDROMORPHONE HCL 1 MG/ML IJ SOLN: 1.0000 mg | INTRAMUSCULAR | Status: DC | PRN

## 2024-12-18 MED ORDER — CHLORHEXIDINE GLUCONATE 0.12 % MT SOLN
15.0000 mL | Freq: Once | OROMUCOSAL | Status: AC
  Administered 2024-12-18: 15 mL via OROMUCOSAL
  Filled 2024-12-18: qty 15

## 2024-12-18 MED ORDER — ARTIFICIAL TEARS OPHTHALMIC OINT
TOPICAL_OINTMENT | OPHTHALMIC | Status: AC
  Filled 2024-12-18: qty 3.5

## 2024-12-18 MED ORDER — DULOXETINE HCL 60 MG PO CPEP: 60.0000 mg | ORAL_CAPSULE | Freq: Every day | ORAL | Status: DC

## 2024-12-18 MED ORDER — BEMPEDOIC ACID-EZETIMIBE 180-10 MG PO TABS: 1.0000 | ORAL_TABLET | Freq: Every day | ORAL | Status: DC

## 2024-12-18 MED ORDER — CARIPRAZINE HCL 1.5 MG PO CAPS: 1.5000 mg | ORAL_CAPSULE | Freq: Every day | ORAL | Status: DC

## 2024-12-18 MED ORDER — ALBUTEROL SULFATE (2.5 MG/3ML) 0.083% IN NEBU: 3.0000 mL | INHALATION_SOLUTION | Freq: Four times a day (QID) | RESPIRATORY_TRACT | Status: DC | PRN

## 2024-12-18 MED ORDER — ALBUMIN HUMAN 5 % IV SOLN: INTRAVENOUS | Status: DC | PRN

## 2024-12-18 MED ORDER — SCOPOLAMINE 1 MG/3DAYS TD PT72
MEDICATED_PATCH | TRANSDERMAL | Status: AC
  Administered 2024-12-18: 1 mg via TRANSDERMAL
  Filled 2024-12-18: qty 1

## 2024-12-18 MED ORDER — FENTANYL CITRATE (PF) 100 MCG/2ML IJ SOLN
INTRAMUSCULAR | Status: AC
  Filled 2024-12-18: qty 2

## 2024-12-18 MED ORDER — FENTANYL CITRATE (PF) 250 MCG/5ML IJ SOLN
INTRAMUSCULAR | Status: DC | PRN
  Administered 2024-12-18: 100 ug via INTRAVENOUS

## 2024-12-18 MED ORDER — CEFAZOLIN SODIUM-DEXTROSE 1-4 GM/50ML-% IV SOLN
1.0000 g | Freq: Three times a day (TID) | INTRAVENOUS | Status: DC
  Administered 2024-12-18: 1 g via INTRAVENOUS
  Filled 2024-12-18: qty 50

## 2024-12-18 MED ORDER — AMLODIPINE BESYLATE 10 MG PO TABS: 10.0000 mg | ORAL_TABLET | Freq: Every day | ORAL | Status: DC

## 2024-12-18 MED ORDER — MAGNESIUM OXIDE -MG SUPPLEMENT 400 (240 MG) MG PO TABS
400.0000 mg | ORAL_TABLET | Freq: Every day | ORAL | Status: DC
  Administered 2024-12-18: 400 mg via ORAL
  Filled 2024-12-18: qty 1

## 2024-12-18 MED ORDER — PHENYLEPHRINE HCL (PRESSORS) 10 MG/ML IV SOLN
INTRAVENOUS | Status: DC | PRN
  Administered 2024-12-18 (×4): 160 ug via INTRAVENOUS

## 2024-12-18 MED ORDER — CEFAZOLIN SODIUM-DEXTROSE 2-4 GM/100ML-% IV SOLN
2.0000 g | INTRAVENOUS | Status: AC
  Administered 2024-12-18: 2 g via INTRAVENOUS
  Filled 2024-12-18: qty 100

## 2024-12-18 MED ORDER — SODIUM CHLORIDE 0.9% FLUSH: 3.0000 mL | Freq: Two times a day (BID) | INTRAVENOUS | Status: DC

## 2024-12-18 MED ORDER — FERROUS SULFATE 325 (65 FE) MG PO TABS
325.0000 mg | ORAL_TABLET | Freq: Every day | ORAL | Status: DC
  Administered 2024-12-18: 325 mg via ORAL
  Filled 2024-12-18: qty 1

## 2024-12-18 MED ORDER — HYDROMORPHONE HCL 1 MG/ML IJ SOLN: 0.2500 mg | INTRAMUSCULAR | Status: DC | PRN

## 2024-12-18 MED ORDER — MENTHOL 3 MG MT LOZG: 1.0000 | LOZENGE | OROMUCOSAL | Status: DC | PRN

## 2024-12-18 MED ORDER — THROMBIN 20000 UNITS EX SOLR
CUTANEOUS | Status: DC | PRN
  Administered 2024-12-18: 20 mL via TOPICAL

## 2024-12-18 MED ORDER — KETOROLAC TROMETHAMINE 15 MG/ML IJ SOLN
30.0000 mg | Freq: Four times a day (QID) | INTRAMUSCULAR | Status: DC
  Administered 2024-12-18: 30 mg via INTRAVENOUS
  Filled 2024-12-18: qty 2

## 2024-12-18 MED ORDER — ONDANSETRON HCL 4 MG/2ML IJ SOLN
INTRAMUSCULAR | Status: AC
  Filled 2024-12-18: qty 2

## 2024-12-18 MED ORDER — MIDAZOLAM HCL 2 MG/2ML IJ SOLN
INTRAMUSCULAR | Status: AC
  Filled 2024-12-18: qty 2

## 2024-12-18 MED ORDER — GLYCOPYRROLATE 0.2 MG/ML IJ SOLN
INTRAMUSCULAR | Status: DC | PRN
  Administered 2024-12-18: .2 mg via INTRAVENOUS

## 2024-12-18 MED ORDER — SUGAMMADEX SODIUM 200 MG/2ML IV SOLN
INTRAVENOUS | Status: DC | PRN
  Administered 2024-12-18: 200 mg via INTRAVENOUS

## 2024-12-18 MED ORDER — LIDOCAINE 2% (20 MG/ML) 5 ML SYRINGE
INTRAMUSCULAR | Status: DC | PRN
  Administered 2024-12-18: 80 mg via INTRAVENOUS

## 2024-12-18 MED ORDER — BUPROPION HCL ER (XL) 150 MG PO TB24: 150.0000 mg | ORAL_TABLET | Freq: Every day | ORAL | Status: DC

## 2024-12-18 MED ORDER — DEXAMETHASONE SOD PHOSPHATE PF 10 MG/ML IJ SOLN
INTRAMUSCULAR | Status: DC | PRN
  Administered 2024-12-18: 10 mg via INTRAVENOUS

## 2024-12-18 MED ORDER — LIDOCAINE 2% (20 MG/ML) 5 ML SYRINGE
INTRAMUSCULAR | Status: AC
  Filled 2024-12-18: qty 5

## 2024-12-18 MED ORDER — ACETAMINOPHEN 500 MG PO TABS
1000.0000 mg | ORAL_TABLET | Freq: Once | ORAL | Status: DC
  Filled 2024-12-18: qty 2

## 2024-12-18 MED ORDER — PHENOL 1.4 % MT LIQD: 1.0000 | OROMUCOSAL | Status: DC | PRN

## 2024-12-18 MED ORDER — HYDROCODONE-ACETAMINOPHEN 10-325 MG PO TABS
2.0000 | ORAL_TABLET | ORAL | Status: DC | PRN
  Administered 2024-12-18: 2 via ORAL
  Filled 2024-12-18: qty 2

## 2024-12-18 MED ORDER — PHENYLEPHRINE HCL-NACL 20-0.9 MG/250ML-% IV SOLN
INTRAVENOUS | Status: DC | PRN
  Administered 2024-12-18: 20 ug/min via INTRAVENOUS

## 2024-12-18 MED ORDER — HYDROCODONE-ACETAMINOPHEN 5-325 MG PO TABS: 1.0000 | ORAL_TABLET | ORAL | Status: DC | PRN

## 2024-12-18 MED ORDER — HYDROCHLOROTHIAZIDE 25 MG PO TABS: 25.0000 mg | ORAL_TABLET | Freq: Every day | ORAL | Status: DC

## 2024-12-18 MED ORDER — HYDROMORPHONE HCL 1 MG/ML IJ SOLN
INTRAMUSCULAR | Status: AC
  Filled 2024-12-18: qty 0.5

## 2024-12-18 MED ORDER — LACTATED RINGERS IV SOLN: INTRAVENOUS | Status: DC | PRN

## 2024-12-18 MED ORDER — GLYCOPYRROLATE PF 0.2 MG/ML IJ SOSY
PREFILLED_SYRINGE | INTRAMUSCULAR | Status: AC
  Filled 2024-12-18: qty 1

## 2024-12-18 MED ORDER — DEXAMETHASONE SOD PHOSPHATE PF 10 MG/ML IJ SOLN
INTRAMUSCULAR | Status: AC
  Filled 2024-12-18: qty 1

## 2024-12-18 MED ORDER — PROPOFOL 10 MG/ML IV BOLUS
INTRAVENOUS | Status: AC
  Filled 2024-12-18: qty 20

## 2024-12-18 MED ORDER — LOSARTAN POTASSIUM 50 MG PO TABS: 100.0000 mg | ORAL_TABLET | Freq: Every day | ORAL | Status: DC

## 2024-12-18 MED ORDER — PROPOFOL 10 MG/ML IV BOLUS
INTRAVENOUS | Status: DC | PRN
  Administered 2024-12-18: 100 mg via INTRAVENOUS

## 2024-12-18 MED ORDER — LEVOTHYROXINE SODIUM 112 MCG PO TABS
112.0000 ug | ORAL_TABLET | Freq: Every day | ORAL | Status: DC
  Filled 2024-12-18: qty 1

## 2024-12-18 MED ORDER — ORAL CARE MOUTH RINSE: 15.0000 mL | Freq: Once | OROMUCOSAL | Status: AC

## 2024-12-18 MED ORDER — LORATADINE 10 MG PO TABS: 10.0000 mg | ORAL_TABLET | Freq: Every day | ORAL | Status: DC

## 2024-12-18 MED ORDER — ACETAMINOPHEN 325 MG PO TABS: 650.0000 mg | ORAL_TABLET | ORAL | Status: DC | PRN

## 2024-12-18 MED ORDER — VITAMIN C 500 MG PO TABS
500.0000 mg | ORAL_TABLET | Freq: Every day | ORAL | Status: DC
  Administered 2024-12-18: 500 mg via ORAL
  Filled 2024-12-18: qty 1

## 2024-12-18 MED ORDER — SODIUM CHLORIDE 0.9% FLUSH: 3.0000 mL | INTRAVENOUS | Status: DC | PRN

## 2024-12-18 NOTE — Interval H&P Note (Signed)
 History and Physical Interval Note:  12/18/2024 7:55 AM  Sabrina Snyder Anon  has presented today for surgery, with the diagnosis of Thoracic spinal stenosis.  The various methods of treatment have been discussed with the patient and family. After consideration of risks, benefits and other options for treatment, the patient has consented to  Procedures with comments: THORACIC DISCECTOMY (Right) - Microdiscectomy - right - T10-T11 as a surgical intervention.  The patient's history has been reviewed, patient examined, no change in status, stable for surgery.  I have reviewed the patient's chart and labs.  Questions were answered to the patient's satisfaction.     [facility] Batya Citron

## 2024-12-18 NOTE — Brief Op Note (Signed)
 12/18/2024  8:00 AM  10:03 AM  PATIENT:  Sabrina Snyder  64 y.o. female  PRE-OPERATIVE DIAGNOSIS:  Thoracic spinal stenosis  POST-OPERATIVE DIAGNOSIS:  Thoracic spinal stenosis  PROCEDURE:  Procedures: MICRODISCECTOMY RIGHT THORACIC TEN-THORACIC ELEVEN (Right)  SURGEON:  Surgeons and Role:    * Louis Shove, MD - Primary    DEWAINE Colon Shove, MD - Assisting  PHYSICIAN ASSISTANT:   ASSISTANTSBETHA Jennetta PIETY   ANESTHESIA:   general  EBL:  100 mL   BLOOD ADMINISTERED:none  DRAINS: none   LOCAL MEDICATIONS USED:  MARCAINE      SPECIMEN:  No Specimen  DISPOSITION OF SPECIMEN:  N/A  COUNTS:  YES  TOURNIQUET:  * No tourniquets in log *  DICTATION: .Dragon Dictation  PLAN OF CARE: Admit for overnight observation  PATIENT DISPOSITION:  PACU - hemodynamically stable.   Delay start of Pharmacological VTE agent (>24hrs) due to surgical blood loss or risk of bleeding: yes

## 2024-12-18 NOTE — Evaluation (Signed)
 Occupational Therapy Evaluation Patient Details Name: Sabrina Snyder MRN: [id number] DOB: 1960/12/05 Today's Date: 12/18/2024   History of Present Illness   64 yo female who underwent MICRODISCECTOMY RIGHT THORACIC TEN-THORACIC ELEVEN.  Past surgical hx: Bilateral L2-3 and L3-4 and L4-5 decompressive laminotomies and foraminotomies. PMHx: anxiety, arthritis, asthma, CFS, CAD, fibromyalgia, hashimotos disease, HTN, HLD, hypothyroidism, IDA, sleep apnea.     Clinical Impressions Patient admitted for the procedure above.  PTA she lives at home with her children, who can assist as needed.  Patient presents very close to her baseline, remaking her pain is less post surgery.  All education performed with teach back, no further OT needs in the acute setting.  Recommend follow up as prescribed by MD.       If plan is discharge home, recommend the following:   Assist for transportation     Functional Status Assessment   Patient has not had a recent decline in their functional status     Equipment Recommendations   None recommended by OT     Recommendations for Other Services         Precautions/Restrictions   Precautions Precautions: Back Precaution Booklet Issued: Yes (comment) Recall of Precautions/Restrictions: Intact Restrictions Weight Bearing Restrictions Per Provider Order: No     Mobility Bed Mobility Overal bed mobility: Modified Independent                  Transfers Overall transfer level: Modified independent Equipment used: Straight cane                      Balance Overall balance assessment: Mild deficits observed, not formally tested                                         ADL either performed or assessed with clinical judgement   ADL Overall ADL's : At baseline                                             Vision Patient Visual Report: No change from baseline       Perception  Perception: Not tested       Praxis Praxis: Not tested       Pertinent Vitals/Pain Pain Assessment Pain Assessment: Faces Faces Pain Scale: Hurts little more Pain Location: Operative Pain Intervention(s): Monitored during session     Extremity/Trunk Assessment Upper Extremity Assessment Upper Extremity Assessment: Overall WFL for tasks assessed   Lower Extremity Assessment Lower Extremity Assessment: Overall WFL for tasks assessed   Cervical / Trunk Assessment Cervical / Trunk Assessment: Back Surgery;Kyphotic   Communication Communication Communication: No apparent difficulties   Cognition Arousal: Alert Behavior During Therapy: WFL for tasks assessed/performed Cognition: No apparent impairments                               Following commands: Intact       Cueing  General Comments   Cueing Techniques: Verbal cues      Exercises     Shoulder Instructions      Home Living Family/patient expects to be discharged to:: Private residence Living Arrangements: Children Available Help at Discharge: Family;Available 24 hours/day Type of Home: House Home  Access: Stairs to enter [employer] of Steps: 2   Home Layout: One level     Bathroom Shower/Tub: Producer, Television/film/video: Standard     Home Equipment: Rollator (4 wheels);Cane - single point;Shower seat;Grab bars - tub/shower;Adaptive equipment Adaptive Equipment: Reacher        Prior Functioning/Environment Prior Level of Function : Independent/Modified Independent;Driving                    OT Problem List: Pain   OT Treatment/Interventions:        OT Goals(Current goals can be found in the care plan section)   Acute Rehab OT Goals Patient Stated Goal: Return home OT Goal Formulation: With patient Time For Goal Achievement: 12/22/24 Potential to Achieve Goals: Good   OT Frequency:       Co-evaluation              AM-PAC OT 6 Clicks  Daily Activity     Outcome Measure Help from another person eating meals?: None Help from another person taking care of personal grooming?: None Help from another person toileting, which includes using toliet, bedpan, or urinal?: None Help from another person bathing (including washing, rinsing, drying)?: None Help from another person to put on and taking off regular upper body clothing?: None Help from another person to put on and taking off regular lower body clothing?: None 6 Click Score: 24   End of Session Nurse Communication: Mobility status  Activity Tolerance: Patient tolerated treatment well Patient left: in bed;with call bell/phone within reach;with family/visitor present  OT Visit Diagnosis: Unsteadiness on feet (R26.81)                Time: 8568-8546 OT Time Calculation (min): 22 min Charges:  OT General Charges $OT Visit: 1 Visit OT Evaluation $OT Eval Moderate Complexity: 1 Mod  12/18/2024  RP, OTR/L  Acute Rehabilitation Services  Office:  [phone]   Charlie JONETTA Halsted 12/18/2024, 2:59 PM

## 2024-12-18 NOTE — Anesthesia Postprocedure Evaluation (Signed)
"   Anesthesia Post Note  Patient: Sabrina Snyder  Procedure(s) Performed: MICRODISCECTOMY RIGHT THORACIC TEN-THORACIC ELEVEN (Right: Back)     Patient location during evaluation: PACU Anesthesia Type: General Level of consciousness: awake and alert Pain management: pain level controlled Vital Signs Assessment: post-procedure vital signs reviewed and stable Respiratory status: spontaneous breathing, nonlabored ventilation, respiratory function stable and patient connected to nasal cannula oxygen Cardiovascular status: blood pressure returned to baseline and stable Postop Assessment: no apparent nausea or vomiting Anesthetic complications: no   No notable events documented.  Last Vitals:  Vitals:   12/18/24 1100 12/18/24 1128  BP: 128/83 120/79  Pulse: (!) 101 98  Resp: 18 16  Temp: 36.6 C 36.6 C  SpO2: 92% 91%    Last Pain:  Vitals:   12/18/24 1130  TempSrc:   PainSc: 2                  Aryella Besecker,W. EDMOND      "

## 2024-12-18 NOTE — Op Note (Signed)
 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  Asst. Surgeon: Colon, MD; Jennetta, NP  Assistant utilized for exposure, decompression, closure  Anesthesia: General  Indication: 64 year old female with back and lower extremity symptoms failing conservative manage her workup demonstrates evidence for broad-based spondylitic disc protrusion with stenosis and cord compression at T10-11 on the right side.  Patient presents now for right-sided T10-T11 transpedicular microdiscectomy.  Operative note: After induction of anesthesia, patient positioned prone on the Wilson frame and appropriately padded.  Lumbar region prepped and draped sterilely.  Incision made overlying T10-11.  Dissection performed in the right.  X-rays were taken.  Level was confirmed.  Laminotomy then performed high-speed drill and Kerrison rongeurs to remove the inferior aspect lamina of T10, the medial aspect of the T10-T11 facet joint, and the superior aspect of the T11 lamina.  Ligament flavum elevated and resected.  Further facet was resected laterally as was the superior aspect of the right T11 pedicle for access to the disc space.  Microscope brought to the field used a micro section of the spinal canal.  Disc space and disc herniation were apparent.  The space was incised.  Herniated disc material was dissected free and removed using micropituitary's.  The drill was then used to further resect the superior aspect of the pedicle leading down into the disc space at T10-11 lateral to the thecal sac.  This allowed to the ability to dissected downward residual disc fragments and remove them from the disc space without causing undue compression of the thecal sac.  At this point a very thorough  discectomy been achieved.  There was no evidence of injury to the thecal sac or nerve roots.  There is no evidence any residual compression.  Wound was irrigated.  Gelfoam was placed topically.  Wound was then closed in layers with Vicryl sutures.  Steri-Strips and sterile dressing were applied.  No apparent complications.  Patient tolerated the procedure well and she returns to the recovery room postop.

## 2024-12-18 NOTE — Plan of Care (Signed)
 Pt doing well. Pt and son given D/C instructions with verbal understanding. Pt's incision is clean and dry with no sign of infection. Pt's IV was removed prior to D/C. Pt D/C'd home via wheelchair per MD order. Pt is stable @ D/C and has no other needs at this time. Rosina Rakers, RN

## 2024-12-18 NOTE — H&P (Signed)
 " Sabrina Snyder is an 64 y.o. female.   Chief Complaint: Back pain HPI: 64 year old female status post multiple lumbar decompression and fusion surgeries presents with worsening back and lower extremity pain failing conservative management.  Workup demonstrates evidence of significant rightward T10-T11 disc herniation with spinal cord compression.  Patient has failed conservative management and presents now for right-sided T10-T11 transpedicular microdiscectomy.  Past Medical History:  Diagnosis Date   Anxiety    Arthritis    Spine   Asthma    CFS (chronic fatigue syndrome)    Coronary artery disease    Family history of adverse reaction to anesthesia    mother woke up during knee surgery   Fibromyalgia    Hashimoto's disease    History of kidney stones    HTN (hypertension)    Hyperlipidemia    Hypothyroidism (acquired)    IDA (iron deficiency anemia)    Sleep apnea     Past Surgical History:  Procedure Laterality Date   BACK SURGERY  08/2023   lumbar fusion   COLONOSCOPY  2024   DILATION AND CURETTAGE OF UTERUS  1990   EAR CYST EXCISION Bilateral    thyroid  ablation     TONSILLECTOMY  1975   TUBAL LIGATION     URETERAL STENT PLACEMENT     VAGINAL HYSTERECTOMY  2013    Family History  Problem Relation Age of Onset   Colon polyps Mother    Atrial fibrillation Mother    Hypertension Mother    Pancreatic cancer Father    Heart disease Father    Hyperlipidemia Father    Diabetes Father    Hypertension Father    Hypertension Sister    Schizophrenia Sister    Hypertension Sister    Colon polyps Brother    Hypertension Brother    Colon cancer Neg Hx    Esophageal cancer Neg Hx    Stomach cancer Neg Hx    Rectal cancer Neg Hx    Social History:  reports that she has never smoked. She has never used smokeless tobacco. She reports that she does not drink alcohol and does not use drugs.  Allergies: Allergies[1]  Medications Prior to Admission  Medication Sig  Dispense Refill   acetaminophen  (TYLENOL ) 650 MG CR tablet Take 650 mg by mouth in the morning, at noon, and at bedtime.     albuterol  (VENTOLIN  HFA) 108 (90 Base) MCG/ACT inhaler Inhale 1-2 puffs into the lungs every 6 (six) hours as needed for wheezing or shortness of breath.     ALPRAZolam  (XANAX ) 0.5 MG tablet Take 0.5 mg by mouth 2 (two) times daily as needed for anxiety.     amLODipine  (NORVASC ) 10 MG tablet Take 1 tablet (10 mg total) by mouth daily. 90 tablet 3   Ascorbic Acid  (VITAMIN C  PO) Take 1 tablet by mouth daily at 12 noon.     Baclofen  5 MG TABS Take 1 tablet by mouth daily as needed (muscle spasms).     Bempedoic Acid -Ezetimibe  (NEXLIZET ) 180-10 MG TABS Take 1 tablet by mouth daily. 90 tablet 3   buPROPion  (WELLBUTRIN  XL) 150 MG 24 hr tablet Take 150 mg by mouth daily.     Calcium  Carbonate-Vitamin D (CALTRATE 600+D PO) Take 1 tablet by mouth daily at 12 noon.     cyanocobalamin  (,VITAMIN B-12,) 1000 MCG/ML injection Inject 1,000 mcg into the skin every 7 (seven) days.  11   DULoxetine  (CYMBALTA ) 60 MG capsule TAKE 1 CAPSULE ONCE A  DAY BY MOUTH DAILY 90 capsule 1   Ferrous Sulfate  (IRON PO) Take 1 tablet by mouth daily at 12 noon.     hydrochlorothiazide  (HYDRODIURIL ) 25 MG tablet TAKE 1 TABLET BY MOUTH EVERY DAY 90 tablet 0   HYDROcodone -acetaminophen  (NORCO/VICODIN) 5-325 MG tablet Take 1 tablet by mouth every 6 (six) hours as needed for moderate pain (pain score 4-6) or severe pain (pain score 7-10).     levothyroxine  (SYNTHROID ) 112 MCG tablet Take 112 mcg by mouth daily before breakfast.     loratadine  (CLARITIN ) 10 MG tablet Take 10 mg by mouth daily.     losartan  (COZAAR ) 100 MG tablet Take 100 mg by mouth daily.     MAGNESIUM  PO Take 1 tablet by mouth daily at 12 noon.     pramipexole  (MIRAPEX ) 0.75 MG tablet Take 0.75 mg by mouth at bedtime as needed (restless leg).     VRAYLAR  1.5 MG capsule Take 1.5 mg by mouth daily.     EPINEPHrine 0.3 mg/0.3 mL IJ SOAJ injection  Inject 0.3 mg into the muscle as needed for anaphylaxis.     Omega-3 Fatty Acids (FISH OIL PO) Take 1 capsule by mouth daily. (Patient not taking: Reported on 12/10/2024)      Results for orders placed or performed during the hospital encounter of 12/16/24 (from the past 48 hours)  Surgical pcr screen     Status: None   Collection Time: 12/16/24  2:22 PM   Specimen: Nasal Mucosa; Nasal Swab  Result Value Ref Range   MRSA, PCR NEGATIVE NEGATIVE   Staphylococcus aureus NEGATIVE NEGATIVE    Comment: (NOTE) The Xpert SA Assay (FDA approved for NASAL specimens in patients 44 years of age and older), is one component of a comprehensive surveillance program. It is not intended to diagnose infection nor to guide or monitor treatment. Performed at [facility], [address] [place], [place] [address]   Basic metabolic panel per protocol     Status: None   Collection Time: 12/16/24  2:27 PM  Result Value Ref Range   Sodium 142 135 - 145 mmol/L   Potassium 3.6 3.5 - 5.1 mmol/L   Chloride 103 98 - 111 mmol/L   CO2 29 22 - 32 mmol/L   Glucose, Bld 92 70 - 99 mg/dL    Comment: Glucose reference range applies only to samples taken after fasting for at least 8 hours.   BUN 13 8 - 23 mg/dL   Creatinine, Ser 9.03 0.44 - 1.00 mg/dL   Calcium  9.8 8.9 - 10.3 mg/dL   GFR, Estimated >39 >39 mL/min    Comment: (NOTE) Calculated using the CKD-EPI Creatinine Equation (2021)    Anion gap 10 5 - 15    Comment: Performed at [facility], [address] [place], [place] [address]  CBC per protocol     Status: None   Collection Time: 12/16/24  2:27 PM  Result Value Ref Range   WBC 5.2 4.0 - 10.5 K/uL   RBC 4.37 3.87 - 5.11 MIL/uL   Hemoglobin 14.8 12.0 - 15.0 g/dL   HCT 58.3 63.9 - 53.9 %   MCV 95.2 80.0 - 100.0 fL   MCH 33.9 26.0 - 34.0 pg   MCHC 35.6 30.0 - 36.0 g/dL   RDW 86.9 88.4 - 84.4 %   Platelets 254 150 - 400 K/uL   nRBC 0.0 0.0 - 0.2 %    Comment: Performed at [facility], [address], [place], [place] [address]   No results found.  Pertinent items noted in HPI and remainder of comprehensive ROS otherwise negative.  Blood pressure 121/77, pulse 84, temperature 98 F (36.7 C), temperature source Oral, resp. rate 18, height 5' 2 (1.575 m), weight 78 kg, SpO2 94%.  Patient is awake and alert.  She is oriented and appropriate.  Speech is fluent.  Judgment insight are intact.  Cranial nerve function normal bilateral.  Motor examination reveals mild weakness in her lower extremities in a nonmyotomal fashion.  Deep tendon reflexes are normal in both upper and lower extremities.  Sensory examination with some decrease sensation pinprick and light touch from T11 distally on the right.  Examination head ears/nose throat is unremarkable chest and abdomen are benign.  Extremities are free of major deformity. Assessment/Plan Right T10-T11 hernia nucleus pulposus with myelopathy.  Plan right T10-T11 laminotomy and transpedicular microdiscectomy.  Risks and benefits been explained.  Patient wishes to proceed.  Victory A Sameer Teeple 12/18/2024, 7:55 AM       [1]  Allergies Allergen Reactions   Codeine Nausea And Vomiting   Doxycycline Nausea And Vomiting   Sulfa Antibiotics Other (See Comments)    Pt states the medication her very uncomfortable; break out in sweats; makes her feel bad Pt requests a nausea medication in conjunction with any sulfa antibiotics if she has to take them    Ace Inhibitors Swelling and Other (See Comments)    Angioedema    Nitrofurantoin     GI intolerance   Pravastatin  Sodium Other (See Comments)    Muscle pain   Rosuvastatin  Calcium  Other (See Comments)    Muscle pain   "

## 2024-12-18 NOTE — Discharge Instructions (Signed)

## 2024-12-18 NOTE — Discharge Summary (Signed)
 Physician Discharge Summary  Patient ID: Sabrina Snyder MRN: [id number] DOB/AGE: 05-11-1961 64 y.o.  Admit date: 12/18/2024 Discharge date: 12/18/2024  Admission Diagnoses:  Discharge Diagnoses:  Principal Problem:   Herniation of intervertebral disc of thoracic spine with myelopathy   Discharged Condition: good  Hospital Course: Patient mated to hospital where she underwent uncomplicated right-sided T10-T11 microdiscectomy for postoperative she is doing very well.  Preoperative pain numbness and weakness are all resolved.  She is standing ambulating voiding without difficulty.  She is ready for discharge home.  Consults:   Significant Diagnostic Studies:   Treatments:   Discharge Exam: Blood pressure 114/74, pulse (!) 101, temperature 98.2 F (36.8 C), temperature source Oral, resp. rate 17, height 5' 2 (1.575 m), weight 78 kg, SpO2 100%. Awake and alert.  Oriented and appropriate.  Motor sensor function intact.  Wound clean and dry.  Chest and abdomen benign.  Disposition: Discharge disposition: 01-Home or Self Care        Allergies as of 12/18/2024       Reactions   Codeine Nausea And Vomiting   Doxycycline Nausea And Vomiting   Sulfa Antibiotics Other (See Comments)   Pt states the medication her very uncomfortable; break out in sweats; makes her feel bad Pt requests a nausea medication in conjunction with any sulfa antibiotics if she has to take them   Ace Inhibitors Swelling, Other (See Comments)   Angioedema    Nitrofurantoin    GI intolerance   Pravastatin  Sodium Other (See Comments)   Muscle pain   Rosuvastatin  Calcium  Other (See Comments)   Muscle pain        Medication List     TAKE these medications    pramipexole  0.75 MG tablet Commonly known as: MIRAPEX  Take 0.75 mg by mouth at bedtime as needed (restless leg). The timing of this medication is very important.   acetaminophen  650 MG CR tablet Commonly known as: TYLENOL  Take 650 mg by  mouth in the morning, at noon, and at bedtime.   albuterol  108 (90 Base) MCG/ACT inhaler Commonly known as: VENTOLIN  HFA Inhale 1-2 puffs into the lungs every 6 (six) hours as needed for wheezing or shortness of breath.   ALPRAZolam  0.5 MG tablet Commonly known as: XANAX  Take 0.5 mg by mouth 2 (two) times daily as needed for anxiety.   amLODipine  10 MG tablet Commonly known as: NORVASC  Take 1 tablet (10 mg total) by mouth daily.   Baclofen  5 MG Tabs Take 1 tablet by mouth daily as needed (muscle spasms).   buPROPion  150 MG 24 hr tablet Commonly known as: WELLBUTRIN  XL Take 150 mg by mouth daily.   CALTRATE 600+D PO Take 1 tablet by mouth daily at 12 noon.   cyanocobalamin  1000 MCG/ML injection Commonly known as: VITAMIN B12 Inject 1,000 mcg into the skin every 7 (seven) days.   DULoxetine  60 MG capsule Commonly known as: CYMBALTA  TAKE 1 CAPSULE ONCE A DAY BY MOUTH DAILY   EPINEPHrine 0.3 mg/0.3 mL Soaj injection Commonly known as: EPI-PEN Inject 0.3 mg into the muscle as needed for anaphylaxis.   FISH OIL PO Take 1 capsule by mouth daily.   hydrochlorothiazide  25 MG tablet Commonly known as: HYDRODIURIL  TAKE 1 TABLET BY MOUTH EVERY DAY   HYDROcodone -acetaminophen  5-325 MG tablet Commonly known as: NORCO/VICODIN Take 1 tablet by mouth every 6 (six) hours as needed for moderate pain (pain score 4-6) or severe pain (pain score 7-10).   IRON PO Take 1 tablet by  mouth daily at 12 noon.   levothyroxine  112 MCG tablet Commonly known as: SYNTHROID  Take 112 mcg by mouth daily before breakfast.   loratadine  10 MG tablet Commonly known as: CLARITIN  Take 10 mg by mouth daily.   losartan  100 MG tablet Commonly known as: COZAAR  Take 100 mg by mouth daily.   MAGNESIUM  PO Take 1 tablet by mouth daily at 12 noon.   Nexlizet  180-10 MG Tabs Generic drug: Bempedoic Acid -Ezetimibe  Take 1 tablet by mouth daily.   VITAMIN C  PO Take 1 tablet by mouth daily at 12 noon.    Vraylar  1.5 MG capsule Generic drug: cariprazine  Take 1.5 mg by mouth daily.         Signed: [facility] LABOR Shavonda Wiedman 12/18/2024, 6:16 PM

## 2024-12-18 NOTE — Transfer of Care (Signed)
 Immediate Anesthesia Transfer of Care Note  Patient: Sabrina Snyder  Procedure(s) Performed: MICRODISCECTOMY RIGHT THORACIC TEN-THORACIC ELEVEN (Right: Back)  Patient Location: PACU  Anesthesia Type:General  Level of Consciousness: awake, drowsy, and patient cooperative  Airway & Oxygen Therapy: Patient Spontanous Breathing and Patient connected to face mask oxygen  Post-op Assessment: Report given to RN and Post -op Vital signs reviewed and stable  Post vital signs: Reviewed and stable  Last Vitals:  Vitals Value Taken Time  BP 119/81 12/18/24 10:30  Temp    Pulse 111 12/18/24 10:30  Resp 14 12/18/24 1030  SpO2 92 % 12/18/24 10:30  Vitals shown include unfiled device data.  Pt. Denies pain   Patients Stated Pain Goal: 1 (12/18/24 9347)  Complications: No notable events documented.

## 2024-12-18 NOTE — Anesthesia Procedure Notes (Addendum)
 Procedure Name: Intubation Date/Time: 12/18/2024 8:09 AM  Performed by: Oley Aleck LABOR, CRNAPre-anesthesia Checklist: Patient identified, Emergency Drugs available, Suction available and Patient being monitored Patient Re-evaluated:Patient Re-evaluated prior to induction Oxygen Delivery Method: Circle system utilized Preoxygenation: Pre-oxygenation with 100% oxygen Induction Type: IV induction Ventilation: Mask ventilation without difficulty Laryngoscope Size: Glidescope and 3 Grade View: Grade I Tube type: Oral Tube size: 7.0 mm Number of attempts: 1 Airway Equipment and Method: Stylet and Video-laryngoscopy Placement Confirmation: ETT inserted through vocal cords under direct vision, positive ETCO2 and breath sounds checked- equal and bilateral Secured at: 21 cm Tube secured with: Tape Dental Injury: Teeth and Oropharynx as per pre-operative assessment  Comments: intubated by C. Juliet Vasbinder, CRNA; ebbs  cta; glide used r/t ho difficult airway - anterior airway, short thyromental distance; small cut to upper right lip after intubation

## 2024-12-19 ENCOUNTER — Encounter (HOSPITAL_COMMUNITY): Payer: Self-pay | Admitting: Neurosurgery
